# Patient Record
Sex: Male | Born: 1990
Health system: Southern US, Community
[De-identification: ages and names within clinical notes are randomized; demographics above are authoritative.]

## PROBLEM LIST (undated history)

## (undated) DIAGNOSIS — H7190 Unspecified cholesteatoma, unspecified ear: Secondary | ICD-10-CM

## (undated) DIAGNOSIS — F32A Depression, unspecified: Secondary | ICD-10-CM

## (undated) DIAGNOSIS — F329 Major depressive disorder, single episode, unspecified: Secondary | ICD-10-CM

## (undated) DIAGNOSIS — F419 Anxiety disorder, unspecified: Secondary | ICD-10-CM

## (undated) HISTORY — DX: Major depressive disorder, single episode, unspecified: F32.9

## (undated) HISTORY — DX: Depression, unspecified: F32.A

## (undated) HISTORY — DX: Anxiety disorder, unspecified: F41.9

## (undated) HISTORY — PX: INCISION AND DRAINAGE ABSCESS: SHX5864

## (undated) HISTORY — PX: SCROTAL EXPLORATION: SHX2386

---

## 2016-09-02 ENCOUNTER — Ambulatory Visit (INDEPENDENT_AMBULATORY_CARE_PROVIDER_SITE_OTHER): Payer: Self-pay | Admitting: Physician Assistant

## 2016-09-02 VITALS — BP 122/72 | HR 98 | Temp 98.8°F | Resp 17 | Ht 68.5 in | Wt 209.0 lb

## 2016-09-02 DIAGNOSIS — R059 Cough, unspecified: Secondary | ICD-10-CM

## 2016-09-02 DIAGNOSIS — H65491 Other chronic nonsuppurative otitis media, right ear: Secondary | ICD-10-CM

## 2016-09-02 DIAGNOSIS — J3489 Other specified disorders of nose and nasal sinuses: Secondary | ICD-10-CM

## 2016-09-02 DIAGNOSIS — R05 Cough: Secondary | ICD-10-CM

## 2016-09-02 MED ORDER — HYDROCODONE-CHLORPHENIRAMINE 5-4 MG/5ML PO SOLN: 5.0000 mL | Freq: Two times a day (BID) | ORAL | 0 refills | Status: DC | PRN

## 2016-09-02 MED ORDER — FLUTICASONE PROPIONATE 50 MCG/ACT NA SUSP: 2.0000 | Freq: Every day | NASAL | 12 refills | Status: DC

## 2016-09-02 MED ORDER — AMOXICILLIN 500 MG PO CAPS: 500.0000 mg | ORAL_CAPSULE | Freq: Three times a day (TID) | ORAL | 0 refills | Status: DC

## 2016-09-02 MED ORDER — BENZONATATE 100 MG PO CAPS: 100.0000 mg | ORAL_CAPSULE | Freq: Three times a day (TID) | ORAL | 0 refills | Status: DC | PRN

## 2016-09-02 NOTE — Progress Notes (Signed)
   Arthur Dodson  MRN: [id number] DOB: 03/04/1991  PCP: No PCP Per Patient  Subjective:  Pt is a 25 year old male, no reported PMH, who presents to clinic for feeling unwell.   Ear problems X 8 months. Reports ear fullness and pain off and on. One episode of drainage describes as wet and bloody three weeks ago.    Sinus pressure, congestion, SOB, headache, x four days. Throat swelling, achy, coughing fits to the point it feels like he's throw up. Notes fatigue.  Tried ibuprofen and dayquil. Denies fever, chills.  Tried sudafed, Helped a little.    No known sick contacts.  Allergies seasonal - sudafed, claritin.   Review of Systems  Constitutional: Positive for fatigue. Negative for chills, diaphoresis and fever.  HENT: Positive for congestion, ear discharge, ear pain, postnasal drip, sinus pressure and sore throat.   Respiratory: Positive for cough, chest tightness and shortness of breath.   Cardiovascular: Negative.   Gastrointestinal: Negative.     There are no active problems to display for this patient.   No current outpatient prescriptions on file prior to visit.   No current facility-administered medications on file prior to visit.     Not on File  Objective:  BP 122/72 (BP Location: Right Arm, Patient Position: Sitting, Cuff Size: Normal)   Pulse 98   Temp 98.8 F (37.1 C) (Oral)   Resp 17   Ht 5' 8.5" (1.74 m)   Wt 209 lb (94.8 kg)   SpO2 98%   BMI 31.32 kg/m   Physical Exam  Constitutional: He is oriented to person, place, and time and well-developed, well-nourished, and in no distress. No distress.  HENT:  Right Ear: Hearing, external ear and ear canal normal. Tympanic membrane is erythematous and bulging. Tympanic membrane is not perforated.  Left Ear: Hearing, tympanic membrane, external ear and ear canal normal.  Cardiovascular: Normal rate, regular rhythm and normal heart sounds.   Neurological: He is alert and oriented to person, place, and  time. GCS score is 15.  Skin: Skin is warm and dry.  Psychiatric: Mood, memory, affect and judgment normal.  Vitals reviewed.   Assessment and Plan :  1. Chronic nonsuppurative otitis media of right ear - amoxicillin (AMOXIL) 500 MG capsule; Take 1 capsule (500 mg total) by mouth 3 (three) times daily.  Dispense: 30 capsule; Refill: 0  2. Cough 3. Rhinorrhea - benzonatate (TESSALON) 100 MG capsule; Take 1-2 capsules (100-200 mg total) by mouth 3 (three) times daily as needed for cough.  Dispense: 40 capsule; Refill: 0 - Hydrocodone-Chlorpheniramine 5-4 MG/5ML SOLN; Take 5 mLs by mouth every 12 (twelve) hours as needed (cough).  Dispense: 100 mL; Refill: 0 - fluticasone (FLONASE) 50 MCG/ACT nasal spray; Place 2 sprays into both nostrils daily.  Dispense: 16 g; Refill: 12 - Supportive care: Drink plenty of fluids and rest. RTC in 7-10 days if not feeling better.   Marco CollieWhitney Brylei Pedley, PA-C  [facility] 09/02/2016 5:07 PM

## 2016-09-02 NOTE — Patient Instructions (Addendum)
Please be sure to drink plenty of fluids and rest.   Thank you for coming in today. I hope you feel we met your needs.  Feel free to call [facility] if you have any questions or further requests.  Please consider signing up for MyChart if you do not already have it, as this is a great way to communicate with me.  Best,  Whitney McVey, PA-C   IF you received an x-ray today, you will receive an invoice from [facility]. Please contact [facility] at [phone] with questions or concerns regarding your invoice.   IF you received labwork today, you will receive an invoice from [employer]. Please contact [facility] at [phone] with questions or concerns regarding your invoice.   Our billing staff will not be able to assist you with questions regarding bills from these companies.  You will be contacted with the lab results as soon as they are available. The fastest way to get your results is to activate your My Chart account. Instructions are located on the last page of this paperwork. If you have not heard from [place] regarding the results in 2 weeks, please contact this office.

## 2016-12-31 DIAGNOSIS — F331 Major depressive disorder, recurrent, moderate: Secondary | ICD-10-CM | POA: Diagnosis not present

## 2017-01-11 DIAGNOSIS — F332 Major depressive disorder, recurrent severe without psychotic features: Secondary | ICD-10-CM | POA: Diagnosis not present

## 2017-01-19 DIAGNOSIS — F332 Major depressive disorder, recurrent severe without psychotic features: Secondary | ICD-10-CM | POA: Diagnosis not present

## 2017-02-01 DIAGNOSIS — F332 Major depressive disorder, recurrent severe without psychotic features: Secondary | ICD-10-CM | POA: Diagnosis not present

## 2017-02-02 DIAGNOSIS — F332 Major depressive disorder, recurrent severe without psychotic features: Secondary | ICD-10-CM | POA: Diagnosis not present

## 2017-02-09 DIAGNOSIS — F332 Major depressive disorder, recurrent severe without psychotic features: Secondary | ICD-10-CM | POA: Diagnosis not present

## 2017-02-17 DIAGNOSIS — F332 Major depressive disorder, recurrent severe without psychotic features: Secondary | ICD-10-CM | POA: Diagnosis not present

## 2017-02-22 DIAGNOSIS — F332 Major depressive disorder, recurrent severe without psychotic features: Secondary | ICD-10-CM | POA: Diagnosis not present

## 2017-02-23 ENCOUNTER — Encounter (HOSPITAL_COMMUNITY): Payer: Self-pay | Admitting: Psychiatry

## 2017-02-23 ENCOUNTER — Encounter (INDEPENDENT_AMBULATORY_CARE_PROVIDER_SITE_OTHER): Payer: Self-pay

## 2017-02-23 ENCOUNTER — Other Ambulatory Visit (HOSPITAL_COMMUNITY): Payer: BLUE CROSS/BLUE SHIELD | Attending: Psychiatry | Admitting: Psychiatry

## 2017-02-23 DIAGNOSIS — F331 Major depressive disorder, recurrent, moderate: Secondary | ICD-10-CM | POA: Insufficient documentation

## 2017-02-23 DIAGNOSIS — Z79899 Other long term (current) drug therapy: Secondary | ICD-10-CM | POA: Insufficient documentation

## 2017-02-23 MED ORDER — TOPIRAMATE 50 MG PO TABS: 50.0000 mg | ORAL_TABLET | Freq: Every day | ORAL | 2 refills | Status: DC

## 2017-02-23 MED ORDER — ESCITALOPRAM OXALATE 10 MG PO TABS: 10.0000 mg | ORAL_TABLET | Freq: Every day | ORAL | 2 refills | Status: DC

## 2017-02-23 MED ORDER — CITALOPRAM HYDROBROMIDE 20 MG PO TABS: 20.0000 mg | ORAL_TABLET | Freq: Every day | ORAL | 2 refills | Status: DC

## 2017-02-23 NOTE — Progress Notes (Signed)
Comprehensive Clinical Assessment (CCA) Note  02/23/2017 Arthur Dodson [id number]  Visit Diagnosis:      ICD-9-CM ICD-10-CM   1. Moderate recurrent major depression (HCC) 296.32 F33.1       CCA Part One  Part One has been completed on paper by the patient.  (See scanned document in Chart Review)  CCA Part Two A  Intake/Chief Complaint:  CCA Intake With Chief Complaint CCA Part Two Date: 02/23/17 CCA Part Two Time: 1429 Chief Complaint/Presenting Problem: This is a 21 single, employed, Caucasian male; who was referred per Dr. Peterson Lombard; treatment for worsening depressive and anxiety symptoms.  States he's been depressed on and off for years.  Multiple stressors: 1) In 2003 father passed and pt has been mother's only support.  She has moved in with patient.  Is on total disability due to her medical  issues along with depression.  Voiced frustration with her not doing more to take care of her health (ie. hx of on and off of smoking cigarettes).  M-GF died three months later after patient's father died.  Then step grandmother sued pt's mother over the will.  In Christmas 2017 he had met a 27 yo lady online, but whenever she went on Christmas break she ended all contact with him.  Denies any prior psychiatric admissions or suicide attempts or gestures.  In 2015 saw a therapist due to depression/anxiety issues.  Family Hx:  Mother (depression)                                                       Patients Currently Reported Symptoms/Problems: Sadness, anhedonia, decreased self esteem, fatigue, irritable, isolative, tearful at times, poor sleep, difficulty concentrating, anxious Collateral Involvement: Mother is supportive. Individual's Strengths: Pt is motivated for treatment. Type of Services Patient Feels Are Needed: MH-IOP  Mental Health Symptoms Depression:  Depression: Change in energy/activity, Difficulty Concentrating, Fatigue, Irritability, Tearfulness, Sleep (too much or little)   Mania:  Mania: N/A  Anxiety:   Anxiety: Irritability  Psychosis:  Psychosis: N/A  Trauma:  Trauma: N/A  Obsessions:  Obsessions: N/A  Compulsions:  Compulsions: N/A  Inattention:  Inattention: N/A  Hyperactivity/Impulsivity:  Hyperactivity/Impulsivity: N/A  Oppositional/Defiant Behaviors:  Oppositional/Defiant Behaviors: N/A  Borderline Personality:  Emotional Irregularity: N/A  Other Mood/Personality Symptoms:      Mental Status Exam Appearance and self-care  Stature:  Stature: Average  Weight:  Weight: Average weight  Clothing:  Clothing: Casual  Grooming:  Grooming: Normal  Cosmetic use:  Cosmetic Use: None  Posture/gait:  Posture/Gait: Normal  Motor activity:  Motor Activity: Not Remarkable  Sensorium  Attention:  Attention: Normal  Concentration:  Concentration: Normal  Orientation:  Orientation: X5  Recall/memory:  Recall/Memory: Normal  Affect and Mood  Affect:  Affect: Anxious  Mood:  Mood: Depressed  Relating  Eye contact:  Eye Contact: Normal  Facial expression:  Facial Expression: Anxious  Attitude toward examiner:  Attitude Toward Examiner: Cooperative  Thought and Language  Speech flow: Speech Flow: Normal  Thought content:  Thought Content: Appropriate to mood and circumstances  Preoccupation:     Hallucinations:     Organization:     Transport planner of Knowledge:  Fund of Knowledge: Average  Intelligence:  Intelligence: Average  Abstraction:  Abstraction: Normal  Judgement:  Judgement: Normal  Reality Testing:  Reality Testing: Adequate  Insight:  Insight: Good  Decision Making:  Decision Making: Normal  Social Functioning  Social Maturity:  Social Maturity: Isolates, Responsible  Social Judgement:  Social Judgement: Normal  Stress  Stressors:  Stressors: Brewing technologist, Housing, Work, Psychologist, forensic Ability:  Coping Ability: English as a second language teacher Deficits:     Supports:      Family and Psychosocial History: Family history Marital status:  Single Are you sexually active?: No What is your sexual orientation?: heterosexual Does patient have children?: No  Childhood History:  Childhood History By whom was/is the patient raised?: Both parents Additional childhood history information: Born in [place], [place].  States he grew up poor, but never went without.  Mother was always ill with health issues.  States he was bullied a lot because he cried a lot.  "I wasn't a tough kid.  I was a quiet, nerdy kid."  4th grade moved to [employer].  His father became ill and was in the hospital for three months before he died.  According to pt, his mom kept him out of school to be at the hospital with her, so he missed a lot of school.  States he discovered his love for theatre in the 9th grade. Patient's description of current relationship with people who raised him/her: Pt's mother resides with him. Does patient have siblings?: No Did patient suffer any verbal/emotional/physical/sexual abuse as a child?: No Did patient suffer from severe childhood neglect?: No Has patient ever been sexually abused/assaulted/raped as an adolescent or adult?: No Was the patient ever a victim of a crime or a disaster?: No Witnessed domestic violence?: No Has patient been effected by domestic violence as an adult?: No  CCA Part Two B  Employment/Work Situation: Employment / Work Copywriter, advertising Employment situation: Employed Where is patient currently employed?: [employer]  How long has patient been employed?: nine months Patient's job has been impacted by current illness: No Has patient ever been in the [employer]?: No Has patient ever served in combat?: No Did You Receive Any Psychiatric Treatment/Services While in Passenger transport manager?: No Are There Guns or Other Weapons in [place]?: No Are These Psychologist, educational?: No  Education: Education Did Teacher, adult education From [employer]?: Yes Did Physicist, medical?: Yes What Type of College 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: Yes Explanation of Sleeping Difficulties: Can't get to sleep due to anxiety  CCA Part Two C  Alcohol/Drug Use: Alcohol / Drug Use History of alcohol / drug use?: No history of alcohol / drug abuse                      CCA Part Three  ASAM's:  Six Dimensions of Multidimensional Assessment  Dimension 1:  Acute Intoxication and/or Withdrawal Potential:     Dimension 2:  Biomedical Conditions and Complications:     Dimension 3:  Emotional, Behavioral, or Cognitive Conditions and Complications:  Dimension 4:  Readiness to Change:     Dimension 5:  Relapse, Continued use, or Continued Problem Potential:     Dimension 6:  Recovery/Living Environment:      Substance use Disorder (SUD)    Social Function:  Social Functioning Social Maturity: Isolates, Responsible Social Judgement: Normal  Stress:  Stress Stressors: Grief/losses, Housing, Work, Barista Ability: Overwhelmed Patient Takes Medications The Way The Doctor Instructed?: Yes Priority Risk: Moderate Risk  Risk Assessment- Self-Harm Potential: Risk Assessment For Self-Harm Potential Thoughts of Self-Harm: No current thoughts Method: No plan Availability of Means: No access/NA  Risk Assessment -Dangerous to Others Potential: Risk Assessment For Dangerous to Others Potential Method: No Plan Availability of  Means: No access or NA Intent: Vague intent or NA Notification Required: No need or identified person  DSM5 Diagnoses: Patient Active Problem List   Diagnosis Date Noted  . Moderate recurrent major depression ([place]) 02/23/2017    Class: Chronic   Patient Centered Plan: Patient is on the following Treatment Plan(s):  Anxiety and Depression  Recommendations for Services/Supports/Treatments: Recommendations for Services/Supports/Treatments Recommendations For Services/Supports/Treatments: IOP (Intensive Outpatient Program)  Treatment Plan Summary:   Oriented pt to MH-IOP.  Provided pt with an orientation folder.  Pt will participate in [place] for two weeks.  Encouraged support groups.  F/U with Dr. Daron Offer and Dr. Peterson Lombard. Referrals to Alternative Service(s): Referred to Alternative Service(s):   Place:   Date:   Time:    Referred to Alternative Service(s):   Place:   Date:   Time:    Referred to Alternative Service(s):   Place:   Date:   Time:    Referred to Alternative Service(s):   Place:   Date:   Time:     Arthur Dodson, RITA, M.Ed, CNA

## 2017-02-23 NOTE — Progress Notes (Signed)
Psychiatric Initial Adult Assessment   Patient Identification: Arthur Dodson MRN:  [id number] Date of Evaluation:  02/23/2017 Referral Source: self Chief Complaint:   Chief Complaint    Depression; Anxiety; Stress     Visit Diagnosis:    ICD-9-CM ICD-10-CM   1. Moderate recurrent major depression (HCC) 296.32 F33.1     History of Present Illness:  [name] has been depressed off and on for years.  Currently he has been depressed for some months and getting worse.  Stresses are tking care of his mother financially and emotionally letting her live with him and the frustration of her not doing more to take care of herself.  Feels stuck in his life overanalyzing himself but getting nowhere.  Depression has reached the point that he cannot work, does not enjoy things he used to enjoy, looks forward to nothing, avoids friends, feels sad, wants to stay in bed all day. Feels guilty that he cannot get himself out of this.  Long term stresses are the death of his father when he was in the fourth grade, his mother's life long depression and emotional dependence on him the death of his maternal grandfather shortly after his father died, his step grandmother suing his mother over the will, being bullied as a kid all the way through school.  He can act okay but does not feel okay and needs to get some help he says.  Associated Signs/Symptoms: Depression Symptoms:  depressed mood, anhedonia, insomnia, hypersomnia, fatigue, difficulty concentrating, impaired memory, anxiety, (Hypo) Manic Symptoms:  Irritable Mood, Anxiety Symptoms:  Excessive Worry, Psychotic Symptoms:  none PTSD Symptoms: Negative  Past Psychiatric History: has been in outpatient therapy and taken medications with no inpatient wxperience  Previous Psychotropic Medications: Yes   Substance Abuse History in the last 12 months:  No.  Consequences of Substance Abuse: Negative  Past Medical History:  Past Medical History:   Diagnosis Date  . Anxiety   . Depression    History reviewed. No pertinent surgical history.  Family Psychiatric History: mother depressed  Family History:  Family History  Problem Relation Age of Onset  . Depression Mother     Social History:   Social History   Social History  . Marital status: Single    Spouse name: N/A  . Number of children: N/A  . Years of education: N/A   Social History Main Topics  . Smoking status: Never Smoker  . Smokeless tobacco: Never Used  . Alcohol use No  . Drug use: No  . Sexual activity: No   Other Topics Concern  . None   Social History Narrative  . None    Additional Social History: Engineer, maintenance (IT)college graduate, not married, no children  Allergies:  No Known Allergies  Metabolic Disorder Labs: No results found for: HGBA1C, MPG No results found for: PROLACTIN No results found for: CHOL, TRIG, HDL, CHOLHDL, VLDL, LDLCALC   Current Medications: Current Outpatient Prescriptions  Medication Sig Dispense Refill  . escitalopram (LEXAPRO) 10 MG tablet Take 10 mg by mouth daily.    . fluticasone (FLONASE) 50 MCG/ACT nasal spray Place 2 sprays into both nostrils daily. 16 g 12  . topiramate (TOPAMAX) 50 MG tablet Take 1 tablet (50 mg total) by mouth daily. 30 tablet 2   No current facility-administered medications for this visit.     Neurologic: Headache: Negative Seizure: Negative Paresthesias:Negative  Musculoskeletal: Strength & Muscle Tone: within normal limits Gait & Station: normal Patient leans: N/A  Psychiatric Specialty Exam: ROS  There were no vitals taken for this visit.There is no height or weight on file to calculate BMI.  General Appearance: Well Groomed  Eye Contact:  Good  Speech:  Clear and Coherent  Volume:  Normal  Mood:  Depressed  Affect:  Congruent  Thought Process:  Coherent and Goal Directed  Orientation:  Full (Time, Place, and Person)  Thought Content:  Logical  Suicidal Thoughts:  No  Homicidal  Thoughts:  No  Memory:  Immediate;   Good Recent;   Good Remote;   Good  Judgement:  Intact  Insight:  Good  Psychomotor Activity:  Normal  Concentration:  Concentration: Good and Attention Span: Good  Recall:  Good  Fund of Knowledge:Good  Language: Good  Akathisia:  Negative  Handed:  Right  AIMS (if indicated):  0  Assets:  Communication Skills Desire for Improvement Financial Resources/Insurance Housing Leisure Time Physical Health Resilience Social Support Talents/Skills Transportation Vocational/Educational  ADL's:  Intact  Cognition: WNL  Sleep:  poor    Treatment Plan Summary: Admit to IOP with daily group therapy.  Continue current meds   Carolanne Grumbling, MD 3/20/201812:36 PM

## 2017-02-24 ENCOUNTER — Other Ambulatory Visit (HOSPITAL_COMMUNITY): Payer: BLUE CROSS/BLUE SHIELD | Admitting: Psychiatry

## 2017-02-24 DIAGNOSIS — F331 Major depressive disorder, recurrent, moderate: Secondary | ICD-10-CM

## 2017-02-24 DIAGNOSIS — Z79899 Other long term (current) drug therapy: Secondary | ICD-10-CM | POA: Diagnosis not present

## 2017-02-24 NOTE — Progress Notes (Signed)
    Daily Group Progress Note  Program: IOP  Group Time: 9:00-12:00  Participation Level: Active  Behavioral Response: Appropriate  Type of Therapy:  Group Therapy  Summary of Progress: Pt. Presented with bright affect, talkative, engaged in the group process. Pt. Participated in grief and loss session with the Chaplain. Pt. Discussed awareness of needs for perfection and fears of appearing inadequate in social situations. Pt. Became tearful when discussing his difficulty experiencing vulnerability. Pt. Participated in breath focused meditation exercise and indicated that it helped him to relax.     Boneta LucksJennifer Tywana Robotham, Ph.D., [facility]

## 2017-02-25 ENCOUNTER — Other Ambulatory Visit (HOSPITAL_COMMUNITY): Payer: BLUE CROSS/BLUE SHIELD | Admitting: Psychiatry

## 2017-02-25 DIAGNOSIS — F331 Major depressive disorder, recurrent, moderate: Secondary | ICD-10-CM

## 2017-02-25 DIAGNOSIS — Z79899 Other long term (current) drug therapy: Secondary | ICD-10-CM | POA: Diagnosis not present

## 2017-02-26 ENCOUNTER — Other Ambulatory Visit (HOSPITAL_COMMUNITY): Payer: BLUE CROSS/BLUE SHIELD | Admitting: Psychiatry

## 2017-02-26 DIAGNOSIS — F331 Major depressive disorder, recurrent, moderate: Secondary | ICD-10-CM

## 2017-02-26 DIAGNOSIS — Z79899 Other long term (current) drug therapy: Secondary | ICD-10-CM | POA: Diagnosis not present

## 2017-02-26 NOTE — Progress Notes (Signed)
    Daily Group Progress Note  Program: IOP  Group Time: 9:00-12:00   Participation Level: active    Behavioral Response: engaged   Type of Therapy:  group therapy   Summary of Progress: Pt. was vocal about his process of understanding things cognitively and how incorporating his emotional processes is difficult.  He reported spending much time being "cerebral" but he knows that emotions have an important place in his life.  It feels new for him to involve his emotions, but he is ready and willing to discuss it more and feel more. Pt. Participated in discussion with mental health association.  Shaune PollackBrown, Jennifer B, LPC

## 2017-03-01 ENCOUNTER — Ambulatory Visit (INDEPENDENT_AMBULATORY_CARE_PROVIDER_SITE_OTHER): Payer: BLUE CROSS/BLUE SHIELD | Admitting: Psychiatry

## 2017-03-01 ENCOUNTER — Other Ambulatory Visit (HOSPITAL_COMMUNITY): Payer: BLUE CROSS/BLUE SHIELD

## 2017-03-01 ENCOUNTER — Encounter (HOSPITAL_COMMUNITY): Payer: Self-pay | Admitting: Psychiatry

## 2017-03-01 VITALS — BP 122/74 | HR 104 | Ht 68.0 in | Wt 220.0 lb

## 2017-03-01 DIAGNOSIS — Z818 Family history of other mental and behavioral disorders: Secondary | ICD-10-CM

## 2017-03-01 DIAGNOSIS — Z79899 Other long term (current) drug therapy: Secondary | ICD-10-CM

## 2017-03-01 DIAGNOSIS — F3341 Major depressive disorder, recurrent, in partial remission: Secondary | ICD-10-CM | POA: Diagnosis not present

## 2017-03-01 MED ORDER — FLUOXETINE HCL 20 MG PO CAPS: 20.0000 mg | ORAL_CAPSULE | Freq: Every day | ORAL | 1 refills | Status: DC
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
## 2017-03-01 NOTE — Progress Notes (Signed)
    Daily Group Progress Note  Program: IOP Group Time: 9:00-12:00   Participation Level: active   Behavioral Response:  engaged   Type of Therapy:  group therapy  Summary of Progress: Pt continues to feel "stuck in his head". He expressed a lot of kindness and support for another group member who fears her marriage is falling apart. Pt recognizes he needs to be kinder to himself, and experience his emotions more, but feels uncertain how to do so, and hopes to learn some skills for reconnecting emotionally from the group. Pt. Participated in grief and loss with the chaplain.  Shaune PollackBrown, Jennifer B, LPC

## 2017-03-01 NOTE — Progress Notes (Signed)
    Daily Group Progress Note  Program: IOP  Group Time: 9:00-12:00   Participation Level: active   Behavioral Response:  engaged   Type of Therapy:   group therapy  Summary of Progress: This was the pt's first day in group. He was open and communicative about how he has tried to "think" his way out of his problems, and how that is no longer working for him. He prizes his intellect, and kindness, however he feels he is unable to extend any kindness to himself.   Shaune PollackBrown, Jennifer B, LPC

## 2017-03-02 ENCOUNTER — Other Ambulatory Visit (HOSPITAL_COMMUNITY): Payer: BLUE CROSS/BLUE SHIELD | Admitting: Psychiatry

## 2017-03-02 DIAGNOSIS — Z79899 Other long term (current) drug therapy: Secondary | ICD-10-CM | POA: Diagnosis not present

## 2017-03-02 DIAGNOSIS — F331 Major depressive disorder, recurrent, moderate: Secondary | ICD-10-CM | POA: Diagnosis not present

## 2017-03-02 NOTE — Progress Notes (Signed)
    Daily Group Progress Note  Program: IOP Group Time: 9:00-12:00   Participation Level:  Active   Behavioral Response:  Engaged   Type of Therapy:  Group Therapy  Summary of Progress: Patient was attentive to others and offered insight/perspective that other group members showed appreciation for. One example of his insight was his processing of how a goal may be limiting.  Pt shared with group what his thought process of cyclical thinking actually sounds like when he feels "stuck in his head". He processed some of his fears of the unknown; especially those fears related to returning to work and the fear of never achieving recovery. Pt received feedback from other group members and reported this was helpful as he fears would most likely be judgmental of his thoughts if they were aware. Patient acknowledges that he is often his 'own worse judge' and sometimes allows 'the committee to influence the judge.'  Carney Bernatherine C Ebin Palazzi, LCSW

## 2017-03-03 ENCOUNTER — Other Ambulatory Visit (HOSPITAL_COMMUNITY): Payer: BLUE CROSS/BLUE SHIELD | Admitting: *Deleted

## 2017-03-03 DIAGNOSIS — F331 Major depressive disorder, recurrent, moderate: Secondary | ICD-10-CM | POA: Diagnosis not present

## 2017-03-03 DIAGNOSIS — Z79899 Other long term (current) drug therapy: Secondary | ICD-10-CM | POA: Diagnosis not present

## 2017-03-03 DIAGNOSIS — F3341 Major depressive disorder, recurrent, in partial remission: Secondary | ICD-10-CM

## 2017-03-03 NOTE — Progress Notes (Signed)
Daily Group Progress Note  Program: IOP  03/03/2017   Group Time: 9 AM - 10:30 AM  Participation Level: Active  Type of Therapy:  Process Group  Summary of Progress: Patient presented with appropriate affect and engaged in group processing. Patient was attentive to others and shared how he related to successes others are experiencing.      Group Time: 10:45 AM - 12 PM  Participation Level:  Active  Type of Therapy: Psycho-education Group  Summary of Progress: Patient participated in group exercise on self compassion and shared differences in how he treats others compared to how he treats himself. Patient recognizes how he has tendency to be authoritative and judgmental with self. Facilitator presented a home practice exercise which group members agreed to complete at least once before group the following day.    Carney Bernatherine C Giovanni Biby, LCSW

## 2017-03-04 ENCOUNTER — Other Ambulatory Visit (HOSPITAL_COMMUNITY): Payer: BLUE CROSS/BLUE SHIELD | Admitting: *Deleted

## 2017-03-04 DIAGNOSIS — F3341 Major depressive disorder, recurrent, in partial remission: Secondary | ICD-10-CM

## 2017-03-04 DIAGNOSIS — Z79899 Other long term (current) drug therapy: Secondary | ICD-10-CM | POA: Diagnosis not present

## 2017-03-04 DIAGNOSIS — F331 Major depressive disorder, recurrent, moderate: Secondary | ICD-10-CM | POA: Diagnosis not present

## 2017-03-04 NOTE — Progress Notes (Signed)
  [facility] - IOP PSYCH  03/04/2017   Group Time: 9 AM - 10:30 AM  Participation Level: Active  Type of Therapy:  Process Group  Summary of Progress: Patient was attentive and supportive as others processed experience with the previous day's home practice and checked in with others in the group. Patient shared that he did not use the home practice as he felt he is still ''recovering/bouncing back from my breakdown Tuesday."     Group Time: 10:45 AM - 12 PM  Participation Level:  Active   Type of Therapy: Process Group  Summary of Progress: Patient offered support and feedback to those who continued to process their thoughts/feelings related to their perception pf the progress. Patient identified with others pain they experience in roles of care giving.     Carney Bernatherine C Harrill, LCSW

## 2017-03-05 ENCOUNTER — Other Ambulatory Visit (HOSPITAL_COMMUNITY): Payer: BLUE CROSS/BLUE SHIELD | Admitting: Psychiatry

## 2017-03-05 DIAGNOSIS — F331 Major depressive disorder, recurrent, moderate: Secondary | ICD-10-CM | POA: Diagnosis not present

## 2017-03-05 DIAGNOSIS — Z79899 Other long term (current) drug therapy: Secondary | ICD-10-CM | POA: Diagnosis not present

## 2017-03-05 DIAGNOSIS — F3341 Major depressive disorder, recurrent, in partial remission: Secondary | ICD-10-CM

## 2017-03-08 ENCOUNTER — Other Ambulatory Visit (HOSPITAL_COMMUNITY): Payer: BLUE CROSS/BLUE SHIELD | Attending: Psychiatry | Admitting: Psychiatry

## 2017-03-08 DIAGNOSIS — F331 Major depressive disorder, recurrent, moderate: Secondary | ICD-10-CM | POA: Insufficient documentation

## 2017-03-08 NOTE — Progress Notes (Signed)
    Daily Group Progress Note  Program: IOP  Group Time: 9:00-12:00   Participation Level:  active   Behavioral Response:  engaged   Type of Therapy:   group therapy  Summary of Progress: Pt feels he has emotionally rebounded from what he has referred to as his "breakdown" in group at the beginning of the week. He realizes he needs to express compassion to himself, but struggle with the intellectual disconnect he feels between his mind and his emotions. Pt. Participated in grief and loss with the Chaplain.  Shaune Pollack, LPC

## 2017-03-09 ENCOUNTER — Other Ambulatory Visit (HOSPITAL_COMMUNITY): Payer: BLUE CROSS/BLUE SHIELD | Admitting: Psychiatry

## 2017-03-09 DIAGNOSIS — F3341 Major depressive disorder, recurrent, in partial remission: Secondary | ICD-10-CM

## 2017-03-09 DIAGNOSIS — F331 Major depressive disorder, recurrent, moderate: Secondary | ICD-10-CM | POA: Diagnosis not present

## 2017-03-10 ENCOUNTER — Other Ambulatory Visit (HOSPITAL_COMMUNITY): Payer: BLUE CROSS/BLUE SHIELD | Admitting: Psychiatry

## 2017-03-10 DIAGNOSIS — F3341 Major depressive disorder, recurrent, in partial remission: Secondary | ICD-10-CM

## 2017-03-10 DIAGNOSIS — F331 Major depressive disorder, recurrent, moderate: Secondary | ICD-10-CM | POA: Diagnosis not present

## 2017-03-10 NOTE — Progress Notes (Signed)
    Daily Group Progress Note  Program: IOP  Group Time: 9:00-12:00  Participation Level: Active  Behavioral Response: Appropriate  Type of Therapy:  Group Therapy  Summary of Progress: Pt was quiet during group and did not add much to group conversation. He stated that he is here to find a "different perspective" because his fear and anxiety have been holding him back from living a "vibrant life". He states that he often has difficulty with black and white thinking and understanding his own emotions. He says that he has to take care of his mother which he resents because the inheritance he got from his grandparents are used for this instead of him having it to better himself. Therapist validated his feelings and provided support and encouragement. Pt. Participated in discussion about co-dependent relationships.     Shaune Pollack, LPC

## 2017-03-11 ENCOUNTER — Other Ambulatory Visit (HOSPITAL_COMMUNITY): Payer: BLUE CROSS/BLUE SHIELD | Admitting: Psychiatry

## 2017-03-11 DIAGNOSIS — F3341 Major depressive disorder, recurrent, in partial remission: Secondary | ICD-10-CM

## 2017-03-11 DIAGNOSIS — F331 Major depressive disorder, recurrent, moderate: Secondary | ICD-10-CM | POA: Diagnosis not present

## 2017-03-11 NOTE — Progress Notes (Signed)
Patient ID: [name], male   DOB: 15-Sep-1991, 26 y.o.   MRN: [id number] [facility] IOP DISCHARGE NOTE  Patient:  Arthur Dodson DOB:  01-Jul-1991  Date of Admission: 02/24/2017  Date of Discharge: 03/11/2017  Reason for Admission:depression  IOP Course:attended and participated.  Felt much better and optimistic that her can continue feeling better.    Mental Status at Discharge:no suicidal thoughts  Diagnosis: major depression, recurrent moderate  Level of Care:  IOP  Discharge destination: has appointment with psychiatrist and will make appointment with his therapist    Comments:  none  The patient received suicide prevention pamphlet:  Yes   Carolanne Grumbling, MD

## 2017-03-11 NOTE — Patient Instructions (Signed)
D:  Pt completed MH-IOP today.  A:  Follow up with Dr. Rene Kocher and Dr. Everardo Pacific.  Return to work on 03-15-17, without any restrictions.  R:  Pt receptive.

## 2017-03-12 ENCOUNTER — Other Ambulatory Visit (HOSPITAL_COMMUNITY): Payer: BLUE CROSS/BLUE SHIELD

## 2017-03-12 NOTE — Progress Notes (Signed)
Arthur Dodson is a 56 single, employed, Caucasian male; who was referred per Dr. Peterson Lombard; treatment for worsening depressive and anxiety symptoms.  Stated he'd been depressed on and off for years.  Multiple stressors: 1) In 2003 father passed and pt has been mother's only support.  She has moved in with patient.  Is on total disability due to her medical  issues along with depression.  Voiced frustration with her not doing more to take care of her health (ie. hx of on and off of smoking cigarettes).  M-GF died three months later after patient's father died.  Then step grandmother sued pt's mother over the will.  In Christmas 2017 he had met a 27 yo lady online, but whenever she went on Christmas break she ended all contact with him.  Denied any prior psychiatric admissions or suicide attempts or gestures.  In 2015 saw a therapist due to depression/anxiety issues.  Family Hx:  Mother (depression). Pt successfully completed MH-IOP.  Pt wanted to stay longer.  "I really enjoy groups.  The connectivity was very helpful."  Reports feeling anxious about leaving but mentioned he is also excited about applying his skills that he learned.  Continues to deny SI/HI or A/V hallucinations.  A:  D/C today.  F/U with Drs. Eksir and [employer].  RTW without any restrictions on 03-15-17.  Encouraged support groups.                         Arthur Dodson, RITA, M.Ed, CNA

## 2017-03-15 ENCOUNTER — Other Ambulatory Visit (HOSPITAL_COMMUNITY): Payer: BLUE CROSS/BLUE SHIELD

## 2017-03-15 NOTE — Progress Notes (Signed)
    Daily Group Progress Note  Program: IOP  Group Time: 9:00-12:00   Participation Level:  active   Behavioral Response:  engaged   Type of Therapy:  group therapy  Summary of Progress: Pt shared about his continued struggles with his inner judge, and his deep desire to make peace with his mother. He feels deeply resentful of her, to the point of "hating" her, but feels the word hate is a bit absurd. He also recognizes that he loves his mother, regardless of the wrongs she's done to him.   Shaune Pollack, LPC

## 2017-03-15 NOTE — Progress Notes (Signed)
    Daily Group Progress Note  Program: IOP  Group Time: 9:00-12:00   Participation Level: active   Behavioral Response:  engaged   Type of Therapy:   group therapy   Summary of Progress: Today was Pt.'s discharge date and he shared some of his journey to integrating his head and his heart; not just intellectualizing everything.  The group shared nice sentiments with Pt. and how he has helped each of them with their own process.  Shaune Pollack, LPC

## 2017-03-16 ENCOUNTER — Other Ambulatory Visit (HOSPITAL_COMMUNITY): Payer: BLUE CROSS/BLUE SHIELD

## 2017-03-31 DIAGNOSIS — H65194 Other acute nonsuppurative otitis media, recurrent, right ear: Secondary | ICD-10-CM | POA: Diagnosis not present

## 2017-04-07 DIAGNOSIS — H66004 Acute suppurative otitis media without spontaneous rupture of ear drum, recurrent, right ear: Secondary | ICD-10-CM | POA: Diagnosis not present

## 2017-04-07 DIAGNOSIS — Z7689 Persons encountering health services in other specified circumstances: Secondary | ICD-10-CM | POA: Diagnosis not present

## 2017-04-18 DIAGNOSIS — H6691 Otitis media, unspecified, right ear: Secondary | ICD-10-CM | POA: Diagnosis not present

## 2017-04-19 ENCOUNTER — Ambulatory Visit (INDEPENDENT_AMBULATORY_CARE_PROVIDER_SITE_OTHER): Payer: BLUE CROSS/BLUE SHIELD | Admitting: Psychiatry

## 2017-04-19 ENCOUNTER — Encounter (HOSPITAL_COMMUNITY): Payer: Self-pay | Admitting: Psychiatry

## 2017-04-19 DIAGNOSIS — Z818 Family history of other mental and behavioral disorders: Secondary | ICD-10-CM | POA: Diagnosis not present

## 2017-04-19 DIAGNOSIS — F3341 Major depressive disorder, recurrent, in partial remission: Secondary | ICD-10-CM

## 2017-04-19 DIAGNOSIS — Z79899 Other long term (current) drug therapy: Secondary | ICD-10-CM | POA: Diagnosis not present

## 2017-04-19 MED ORDER — FLUOXETINE HCL 40 MG PO CAPS: 40.0000 mg | ORAL_CAPSULE | Freq: Every day | ORAL | 1 refills | Status: DC

## 2017-04-19 NOTE — Progress Notes (Signed)
BH MD/PA/NP OP Progress Note  04/19/2017 8:34 AM Jadd Gasior  MRN:  [id number]  Chief Complaint:  Chief Complaint    Follow-up     Subjective:  Arthur Dodson today for depression follow-up. He reports that Prozac has been well tolerated, and his mood is much more improved than Lexapro and Topamax. He confirms that he has stopped taking both of those medications. He denies any suicidality. He has had some stress from work, and is struggling with her chronic ear infection. He reports that his mood is about 60% better, and wonders about increasing the Prozac to 40 mg. We agreed to increase to 40 mg, and reviewed the risks and benefits. Agrees to follow-up in 3 months.  Visit Diagnosis:    ICD-9-CM ICD-10-CM   1. Recurrent major depressive disorder, in partial remission (HCC) 296.35 F33.41 FLUoxetine (PROZAC) 40 MG capsule    Past Psychiatric History: See intake H&P for full details. Reviewed, with no updates at this time.   Past Medical History:  Past Medical History:  Diagnosis Date  . Anxiety   . Depression    History reviewed. No pertinent surgical history.  Family Psychiatric History: See intake H&P for full details. Reviewed, with no updates at this time.   Family History:  Family History  Problem Relation Age of Onset  . Depression Mother     Social History:  Social History   Social History  . Marital status: Single    Spouse name: N/A  . Number of children: N/A  . Years of education: N/A   Social History Main Topics  . Smoking status: Never Smoker  . Smokeless tobacco: Never Used  . Alcohol use No  . Drug use: No  . Sexual activity: No   Other Topics Concern  . None   Social History Narrative  . None    Allergies: No Known Allergies  Metabolic Disorder Labs: No results found for: HGBA1C, MPG No results found for: PROLACTIN No results found for: CHOL, TRIG, HDL, CHOLHDL, VLDL, LDLCALC   Current Medications: Current Outpatient Prescriptions   Medication Sig Dispense Refill  . FLUoxetine (PROZAC) 40 MG capsule Take 1 capsule (40 mg total) by mouth daily. 90 capsule 1  . azithromycin (ZITHROMAX) 250 MG tablet   0  . fluticasone (FLONASE) 50 MCG/ACT nasal spray Place 2 sprays into both nostrils daily. (Patient not taking: Reported on 04/19/2017) 16 g 12   No current facility-administered medications for this visit.     Neurologic: Headache: Negative Seizure: Negative Paresthesias: Negative  Musculoskeletal: Strength & Muscle Tone: within normal limits Gait & Station: normal Patient leans: N/A  Psychiatric Specialty Exam: ROS  Blood pressure 102/78, pulse 92, height 5' 8.6" (1.742 m), weight 221 lb (100.2 kg), SpO2 98 %.Body mass index is 33.02 kg/m.  General Appearance: Casual and Fairly Groomed  Eye Contact:  Good  Speech:  Clear and Coherent  Volume:  Normal  Mood:  Dysphoric  Affect:  Appropriate  Thought Process:  Coherent and Goal Directed  Orientation:  Full (Time, Place, and Person)  Thought Content: Logical   Suicidal Thoughts:  No  Homicidal Thoughts:  No  Memory:  Immediate;   Good  Judgement:  Good  Insight:  Good  Psychomotor Activity:  Normal  Concentration:  Concentration: Good  Recall:  Good  Fund of Knowledge: NA  Language: Good  Akathisia:  Negative  Handed:  Right  AIMS (if indicated):  0  Assets:  Communication Skills Desire for Improvement Housing Physical  Health Social Support Talents/Skills Transportation Vocational/Educational  ADL's:  Intact  Cognition: WNL  Sleep:  7-9 hours    Treatment Plan Summary: Arthur Lenisnthony Amadon is a 26 year old male with major depressive disorder, with significant improvement from Prozac, but still room for further improvement. No acute safety issues. We will increase fluoxetine to 40 mg and follow-up in 3 months.  1. Recurrent major depressive disorder, in partial remission (HCC)    Increase fluoxetine to 40 mg Follow-up in 3 months Patient to  schedule with his external therapist for follow-up, given that he is recently completed the IOP program   Burnard LeighAlexander Arya Tessica Cupo, MD 04/19/2017, 8:34 AM

## 2017-05-04 DIAGNOSIS — H9201 Otalgia, right ear: Secondary | ICD-10-CM | POA: Diagnosis not present

## 2017-05-04 DIAGNOSIS — H66004 Acute suppurative otitis media without spontaneous rupture of ear drum, recurrent, right ear: Secondary | ICD-10-CM | POA: Diagnosis not present

## 2017-05-06 DIAGNOSIS — J343 Hypertrophy of nasal turbinates: Secondary | ICD-10-CM | POA: Diagnosis not present

## 2017-05-06 DIAGNOSIS — H90A11 Conductive hearing loss, unilateral, right ear with restricted hearing on the contralateral side: Secondary | ICD-10-CM | POA: Diagnosis not present

## 2017-05-06 DIAGNOSIS — H7101 Cholesteatoma of attic, right ear: Secondary | ICD-10-CM | POA: Diagnosis not present

## 2017-05-07 ENCOUNTER — Other Ambulatory Visit: Payer: Self-pay | Admitting: Otolaryngology

## 2017-05-07 DIAGNOSIS — H7101 Cholesteatoma of attic, right ear: Secondary | ICD-10-CM

## 2017-05-11 ENCOUNTER — Ambulatory Visit
Admission: RE | Admit: 2017-05-11 | Discharge: 2017-05-11 | Disposition: A | Discharge status: Home / Self Care | Payer: BLUE CROSS/BLUE SHIELD | Source: Ambulatory Visit | Attending: Otolaryngology | Admitting: Otolaryngology

## 2017-05-11 DIAGNOSIS — H9190 Unspecified hearing loss, unspecified ear: Secondary | ICD-10-CM | POA: Diagnosis not present

## 2017-05-11 DIAGNOSIS — H7101 Cholesteatoma of attic, right ear: Secondary | ICD-10-CM

## 2017-06-18 ENCOUNTER — Emergency Department (HOSPITAL_COMMUNITY): Payer: BLUE CROSS/BLUE SHIELD

## 2017-06-18 ENCOUNTER — Encounter (HOSPITAL_COMMUNITY): Payer: Self-pay | Admitting: Emergency Medicine

## 2017-06-18 ENCOUNTER — Observation Stay (HOSPITAL_COMMUNITY)
Admission: EM | Admit: 2017-06-18 | Discharge: 2017-06-19 | Disposition: A | Discharge status: Home / Self Care | Payer: BLUE CROSS/BLUE SHIELD | Attending: Family Medicine | Admitting: Family Medicine

## 2017-06-18 DIAGNOSIS — F331 Major depressive disorder, recurrent, moderate: Secondary | ICD-10-CM | POA: Diagnosis not present

## 2017-06-18 DIAGNOSIS — E669 Obesity, unspecified: Secondary | ICD-10-CM | POA: Diagnosis not present

## 2017-06-18 DIAGNOSIS — R05 Cough: Secondary | ICD-10-CM | POA: Diagnosis not present

## 2017-06-18 DIAGNOSIS — J690 Pneumonitis due to inhalation of food and vomit: Secondary | ICD-10-CM | POA: Diagnosis not present

## 2017-06-18 DIAGNOSIS — F411 Generalized anxiety disorder: Secondary | ICD-10-CM | POA: Diagnosis not present

## 2017-06-18 DIAGNOSIS — H7101 Cholesteatoma of attic, right ear: Secondary | ICD-10-CM | POA: Diagnosis not present

## 2017-06-18 DIAGNOSIS — T17908A Unspecified foreign body in respiratory tract, part unspecified causing other injury, initial encounter: Secondary | ICD-10-CM | POA: Diagnosis not present

## 2017-06-18 DIAGNOSIS — Z79899 Other long term (current) drug therapy: Secondary | ICD-10-CM | POA: Insufficient documentation

## 2017-06-18 DIAGNOSIS — Z6833 Body mass index (BMI) 33.0-33.9, adult: Secondary | ICD-10-CM | POA: Insufficient documentation

## 2017-06-18 DIAGNOSIS — F419 Anxiety disorder, unspecified: Secondary | ICD-10-CM | POA: Insufficient documentation

## 2017-06-18 DIAGNOSIS — Z9889 Other specified postprocedural states: Secondary | ICD-10-CM | POA: Insufficient documentation

## 2017-06-18 DIAGNOSIS — R0902 Hypoxemia: Secondary | ICD-10-CM | POA: Diagnosis not present

## 2017-06-18 DIAGNOSIS — H7191 Unspecified cholesteatoma, right ear: Secondary | ICD-10-CM | POA: Diagnosis not present

## 2017-06-18 DIAGNOSIS — Z818 Family history of other mental and behavioral disorders: Secondary | ICD-10-CM | POA: Diagnosis not present

## 2017-06-18 DIAGNOSIS — H7190 Unspecified cholesteatoma, unspecified ear: Secondary | ICD-10-CM

## 2017-06-18 DIAGNOSIS — H90A11 Conductive hearing loss, unilateral, right ear with restricted hearing on the contralateral side: Secondary | ICD-10-CM | POA: Diagnosis not present

## 2017-06-18 DIAGNOSIS — F329 Major depressive disorder, single episode, unspecified: Secondary | ICD-10-CM | POA: Diagnosis not present

## 2017-06-18 DIAGNOSIS — F32A Depression, unspecified: Secondary | ICD-10-CM

## 2017-06-18 HISTORY — DX: Unspecified cholesteatoma, unspecified ear: H71.90

## 2017-06-18 LAB — I-STAT CHEM 8, ED
BUN: 12 mg/dL (ref 6–20)
CHLORIDE: 101 mmol/L (ref 101–111)
Calcium, Ion: 1.09 mmol/L — ABNORMAL LOW (ref 1.15–1.40)
Creatinine, Ser: 1 mg/dL (ref 0.61–1.24)
Glucose, Bld: 128 mg/dL — ABNORMAL HIGH (ref 65–99)
HCT: 47 % (ref 39.0–52.0)
Hemoglobin: 16 g/dL (ref 13.0–17.0)
Potassium: 4.5 mmol/L (ref 3.5–5.1)
SODIUM: 138 mmol/L (ref 135–145)
TCO2: 25 mmol/L (ref 0–100)

## 2017-06-18 MED ORDER — FLUOXETINE HCL 20 MG PO CAPS
40.0000 mg | ORAL_CAPSULE | Freq: Every day | ORAL | Status: DC
  Administered 2017-06-19: 40 mg via ORAL
  Filled 2017-06-18: qty 2

## 2017-06-18 MED ORDER — FLUOXETINE HCL 40 MG PO CAPS: 40.0000 mg | ORAL_CAPSULE | Freq: Every day | ORAL | Status: DC

## 2017-06-18 NOTE — ED Triage Notes (Signed)
Pt here from surgical center for possible aspiration after surgery , pt arrived alert and oriented , pt placed on RA  sats 96 %,

## 2017-06-18 NOTE — ED Notes (Signed)
Dinner tray ordered; regular diet 

## 2017-06-18 NOTE — ED Notes (Signed)
Report attempted. RN to call back.

## 2017-06-18 NOTE — H&P (Addendum)
Family Medicine Teaching [facility] Ad[facility] History and Physical Service Pager: [phone]  Patient name: Arthur Dodson Medical record number: [id number] Date of birth: Dec 20, 1990 Age: 26 y.o. Gender: male  Primary Care Provider: Patient, No Pcp Per Consultants: none Code Status: FULL   Chief Complaint: aspiration pneumonia  Assessment and Plan: Arthur Lenisnthony Curless is a 26 y.o. male with PMH of cholesteotoma, anxiety, depression presenting with concern for aspiration of gastric contents post-procedure. He was having a tympanoplasty performed for a cholesteotoma and vomited up bloody gastric contents which subsequently went down the LMA. He initially required 10L [place] but was weaned down to room air. He is currently satting in 90s on room air with no shortness of breath. Xray was performed which revealed no consolidations consistent with pneumonia. He did not receive any antibiotics en route or in ED. No white count obtained. Afebrile, slight cough but no bloody sputum.   Aspiration Currently satting well on room air. Scattered wheezes bilaterally, no concern for pneumonia on chest xray. While xray is normal abnormal findings can be delayed with a short time after aspiration. No symptoms aside from cough which is not producing any sputum. Complaining of sore throat which is most likely from intubation. Will keep overnight for obs to monitor respiratory status. No indication for antibiotics. Eating burger in ed with no other s/s of aspiration. Ok for regular diet. - admit for observation to Dr. Pollie MeyerMcintyre - incentive spirometry - monitor o2 sats, monitor breathing clinically - Will consider o2 if respiratory status declines - no indication for antibiotics at present - repeat CXR in am  Cholesteatoma Patient was undergoing tympanoplasty and reconstruction. He aspirated while under anesthesia. Per reports the surgery was successfully completed. Patient is to place cotton packing infused with  neosporin daily into left ear canal. He is to follow up with surgeon in 2 weeks. - continue packing ear with neosporin daily - keep follow up appointment with ent surgeon  Anxiety/depression Patient with past diagnosis of anxiety/depression. Taken prozac 40mg  at home. Will continue that medication here. - continue prozac 40mg  daily  FEN/GI: regular diet Prophylaxis: SCDs   Disposition: observe on med-surg   History of Present Illness:  Arthur Lenisnthony Stiggers is a [age] y.o. male presenting with possible aspiration event after outpatient surgery today. Per reports, patient had aspiration of gastric contents during the procedure that appeared like coffee grounds per report. He was placed on 10L Nasal cannula initially in PACU. When he woke up, felt normal with a mild sore throat. At this point he was satting in 90s on RA. [facility]. He reports no shortness of breath in the ed. Prior to his surgery he did not eat or drink anything after 10pm the night before. He still has a mild cough. Denies any bloody sputum.  He was to have a cholesteotoma excised with ear drum reconstruction. Per reports from the patient's mother the procedure was finished when the aspiration event happened. He was told to pack his ear with cotton infused with neosporin daily and follow up with ent in 2 weeks.  Symptoms of the cholesteatoma were decreased hearing and some pain in the right ear. He did have some discharge from the ear but that resolved a couple of months ago. No bleeding in ear. Denies history of ulcers or GERD.  Denies fevers, chills, sweats, nausea, vomiting, diarrhea prior to procedure.   In the ED, VSS, CXR without acute findings.   Review Of Systems: Per HPI  ROS  Patient Active Problem List   Diagnosis Date Noted  . Aspiration into airway 06/18/2017  . Moderate recurrent major depression (HCC) 02/23/2017    Class: Chronic    Past Medical History: Past Medical History:   Diagnosis Date  . Anxiety   . Depression     Past Surgical History: History reviewed. No pertinent surgical history.  Social History: Social History  Substance Use Topics  . Smoking status: Never Smoker  . Smokeless tobacco: Never Used  . Alcohol use Less than once per month   Additional social history: lives with mom, roommate  Please also refer to relevant sections of EMR.  Family History: Family History  Problem Relation Age of Onset  . Depression Mother     Allergies and Medications: No Known Allergies No current facility-administered medications on file prior to encounter.    Current Outpatient Prescriptions on File Prior to Encounter  Medication Sig Dispense Refill  . FLUoxetine (PROZAC) 40 MG capsule Take 1 capsule (40 mg total) by mouth daily. 90 capsule 1    Objective: BP 121/68   Pulse 85   Temp 99 F (37.2 C) (Oral)   Resp 11   SpO2 95%  Exam: General: no acute distress, resting comfortably, AOx4 Eyes: EOMI, PERRl ENTM: left ear normal ear canal and tympanic membrane, Right ear packed with cotton gauze, slightly bloody. Good dentition.  Neck: supple, no lymphadenopathy Cardiovascular: rrr, no murmurs, gallops, rubs appreciated. Palpable PT/DP bilaterally, palpable radial pulse bilaterally Respiratory: scattered wheezes in lungs bilaterally, no pleuritic chest pain Gastrointestinal: soft, non-tender, non-distended. MSK: able to move all four extremities, 5/5 strength BUE, BLE Derm: warm, dry, no rashes noted Neuro: AOx4, 5/5 strength all muscle groups BLE/BUE Psych: appropriate  Labs and Imaging: CBC BMET   Recent Labs Lab 06/18/17 1520  HGB 16.0  HCT 47.0    Recent Labs Lab 06/18/17 1520  NA 138  K 4.5  CL 101  BUN 12  CREATININE 1.00  GLUCOSE 128Myrene Buddy, MD 06/18/2017, 6:23 PM PGY-1, [facility] Family Medicine FPTS Intern pager: [phone], text pages welcome  FPTS Upper-Level Resident Addendum  I have  independently interviewed and examined the patient. I have discussed the above with the original author and agree with their documentation. My edits for correction/addition/clarification are in blue. Please see also any attending notes.   Loni Muse, MD PGY-2, [place] [facility] FPTS Service pager: [phone] (text pages welcome through [facility])

## 2017-06-18 NOTE — ED Notes (Signed)
Patient transported to X-ray 

## 2017-06-18 NOTE — ED Provider Notes (Signed)
[facility]-EMERGENCY DEPT Provider Note   CSN: [id number] Arrival date & time: 06/18/17  1349     History   Chief Complaint Chief Complaint  Patient presents with  . Shortness of Breath    HPI Arthur Dodson is a 26 y.o. male.  The history is provided by the patient. No language interpreter was used.   Arthur Dodson is a 26 y.o. male who presents to the Emergency Department complaining of aspiration.  He presents from the surgery Center following an aspiration event. He was under general anesthesia with an LMA for tympanoplasty when the anesthesiologist there is some gastric contents coming up through the LMA and some possible coffee-ground material. He was temporarily intubated and given albuterol treatment through the ET tube as well as 12 mg of Decadron and 10 g of Reglan. He was subsequently extubated when he awoke from anesthesia and placed on facemask 10 L oxygen. He was satting around 90% on 10 L and referred to the emergency department for further monitoring. On ED arrival patient reports mild sore throat and otherwise feeling well. He does endorse some reflux and indigestion type symptoms that are intermittent indolent nature. He denies any black or bloody stools.   Past Medical History:  Diagnosis Date  . Anxiety   . Depression     Patient Active Problem List   Diagnosis Date Noted  . Moderate recurrent major depression (HCC) 02/23/2017    Class: Chronic    History reviewed. No pertinent surgical history.     Home Medications    Prior to Admission medications   Medication Sig Start Date End Date Taking? Authorizing Provider  azithromycin (ZITHROMAX) 250 MG tablet  04/18/17   [provider]  FLUoxetine (PROZAC) 40 MG capsule Take 1 capsule (40 mg total) by mouth daily. 04/19/17 04/19/18  Burnard LeighEksir, Alexander Arya, MD  fluticasone (FLONASE) 50 MCG/ACT nasal spray Place 2 sprays into both nostrils daily. Patient not taking: Reported on 04/19/2017 09/02/16   McVey,  Madelaine BhatElizabeth Whitney, PA-C    Family History Family History  Problem Relation Age of Onset  . Depression Mother     Social History Social History  Substance Use Topics  . Smoking status: Never Smoker  . Smokeless tobacco: Never Used  . Alcohol use No     Allergies   Patient has no known allergies.   Review of Systems Review of Systems  All other systems reviewed and are negative.    Physical Exam Updated Vital Signs BP 135/87 (BP Location: Right Arm)   Pulse 98   Temp 99 F (37.2 C) (Oral)   Resp 13   SpO2 96%   Physical Exam  Constitutional: He is oriented to person, place, and time. He appears well-developed and well-nourished.  HENT:  Head: Normocephalic and atraumatic.  Dressing in the right ear  Cardiovascular: Normal rate and regular rhythm.   No murmur heard. Pulmonary/Chest: Effort normal and breath sounds normal. No respiratory distress.  Abdominal: Soft. There is no tenderness. There is no rebound and no guarding.  Musculoskeletal: He exhibits no edema or tenderness.  Neurological: He is alert and oriented to person, place, and time.  Skin: Skin is warm and dry. There is pallor.  Psychiatric: He has a normal mood and affect. His behavior is normal.  Nursing note and vitals reviewed.    ED Treatments / Results  Labs (all labs ordered are listed, but only abnormal results are displayed) Labs Reviewed - No data to display  EKG  EKG Interpretation  None       Radiology No results found.  Procedures Procedures (including critical care time)  Medications Ordered in ED Medications - No data to display   Initial Impression / Assessment and Plan / ED Course  I have reviewed the triage vital signs and the nursing notes.  Pertinent labs & imaging results that were available during my care of the patient were reviewed by me and considered in my medical decision making (see chart for details).     Patient here following an aspiration event.  He complains of mild sore throat and feeling weak but otherwise well. He has no respiratory distress on examination. His oxygen sats are between 90 and 94% on room air. He has no respiratory distress. Plan to admit for observation given aspiration event. No evidence of bacterial infection at this time.  Final Clinical Impressions(s) / ED Diagnoses   Final diagnoses:  Aspiration into airway    New Prescriptions New Prescriptions   No medications on file     Tilden Fossa, MD 06/19/17 [phone]

## 2017-06-18 NOTE — Progress Notes (Signed)
Arthur Lenisnthony Prevo [id number] Admitted to [id number]: 06/18/2017 9:05 PM Attending Provider: Latrelle DodrillMcIntyre, Brittany J, MD    Arthur Dodson is a 26 y.o. male patient admitted from ED awake, alert  & orientated  X 3,  No Order, VSS - Blood pressure 114/75, pulse (!) 110, temperature 99.3 F (37.4 C), temperature source Oral, resp. rate 18, height 5\' 8"  (1.727 m), weight 99.2 kg (218 lb 11.2 oz), SpO2 95 %.RA, no c/o shortness of breath, no c/o chest pain, no distress noted.    IV site WDL:  with a transparent dsg that's clean dry and intact.  Allergies:  No Known Allergies   Past Medical History:  Diagnosis Date  . Anxiety   . Cholesteatoma   . Depression     History:  obtained from patient  Pt orientation to unit, room and routine. Information packet given to patient.  Admission INP armband ID verified with patient, and in place. SR up x 2, fall risk assessment complete with Patient  verbalizing understanding of risks associated with falls. Pt verbalizes an understanding of how to use the call bell and to call for help before getting out of bed.  Skin, clean-dry- intact without evidence of bruising, or skin tears.   No evidence of skin break down noted on exam.   Will cont to monitor and assist as needed.  Elisha PonderFaucette, Nori Poland Nicole, RN 06/18/2017 9:05 PM

## 2017-06-18 NOTE — ED Notes (Signed)
Sats 94-95 % while ambulating

## 2017-06-18 NOTE — ED Notes (Signed)
Second attempt made for report, secretary states RN is on contact room.

## 2017-06-19 ENCOUNTER — Observation Stay (HOSPITAL_COMMUNITY): Payer: BLUE CROSS/BLUE SHIELD

## 2017-06-19 DIAGNOSIS — T17908A Unspecified foreign body in respiratory tract, part unspecified causing other injury, initial encounter: Secondary | ICD-10-CM | POA: Diagnosis not present

## 2017-06-19 DIAGNOSIS — F411 Generalized anxiety disorder: Secondary | ICD-10-CM

## 2017-06-19 DIAGNOSIS — F329 Major depressive disorder, single episode, unspecified: Secondary | ICD-10-CM

## 2017-06-19 DIAGNOSIS — H7190 Unspecified cholesteatoma, unspecified ear: Secondary | ICD-10-CM | POA: Diagnosis not present

## 2017-06-19 DIAGNOSIS — Z01818 Encounter for other preprocedural examination: Secondary | ICD-10-CM | POA: Diagnosis not present

## 2017-06-19 DIAGNOSIS — F32A Depression, unspecified: Secondary | ICD-10-CM

## 2017-06-19 LAB — HIV ANTIBODY (ROUTINE TESTING W REFLEX): HIV Screen 4th Generation wRfx: NONREACTIVE

## 2017-06-19 MED ORDER — AMOXICILLIN-POT CLAVULANATE 875-125 MG PO TABS: 1.0000 | ORAL_TABLET | Freq: Two times a day (BID) | ORAL | 0 refills | Status: AC

## 2017-06-19 MED ORDER — BACITRACIN-NEOMYCIN-POLYMYXIN OINTMENT TUBE
TOPICAL_OINTMENT | Freq: Once | CUTANEOUS | Status: AC
Start: 2017-06-19 — End: 2017-06-19
  Administered 2017-06-19: 05:00:00 via TOPICAL
  Filled 2017-06-19: qty 14.17

## 2017-06-19 NOTE — Care Management Note (Signed)
Case Management Note  Patient Details  Name: Arthur Dodson MRN: [id number] Date of Birth: October 18, 1991  Subjective/Objective:                 Patient with order to DC to home today. Chart reviewed. No Home Health or Equipment needs, no unacknowledged Case Management consults or medication needs identified at the time of this note. Plan for DC to home. If needs arise today prior to discharge, please call [name] RN CM at [phone].    Action/Plan:   Expected Discharge Date:  06/19/17               Expected Discharge Plan:  Home/Self Care  In-House Referral:     Discharge planning Services  CM Consult  Post Acute Care Choice:    Choice offered to:     DME Arranged:    DME Agency:     HH Arranged:    HH Agency:     Status of Service:  Completed, signed off  If discussed at [employer] Length of Stay Meetings, dates discussed:    Additional Comments:  [name], RN 06/19/2017, 12:40 PM

## 2017-06-19 NOTE — Discharge Summary (Signed)
Family Medicine Teaching Service  Discharge Note: Attending Levert Feinstein MD  I have reviewed this patient and the patient's chart and have discussed discharge planning with the resident at the time of discharge. I agree with the discharge plan as below.    Discharge summary edited to include addition of augmentin upon discharge due to coarse breath sounds on my exam, also that patient elected to go ahead and schedule follow up with [place] at [facility] prior to discharge.     Family Medicine Teaching [facility] Discharge Summary  Patient name: Arthur Dodson Medical record number: [id number] Date of birth: 02-24-1991 Age: 26 y.o. Gender: male Date of Admission: 06/18/2017  Date of Discharge: 06/19/17 Admitting Physician: Latrelle Dodrill, MD  Primary Care Provider: Patient, No Pcp Per Consultants: none  Indication for Hospitalization: aspiration event   Discharge Diagnoses/Problem List:  Cholesteotoma - reconstructed by ENT Anxiety and depression  Disposition: home  Discharge Condition: stable  Discharge Exam:  Gen: well appearing male sitting on EOB in NAD ENTM: L ear atraumatic, R ear with clean cotton ball in external canal. MMM.  CV: RRR, no murmur Resp: CTAB, easy WOB Abd: SNTND, +BS Ext: no edema, warm Neuro: alert and oriented, cooperative, conversational  Brief Hospital Course:  Edge Mauger is a 26 y.o. male presenting with possible aspiration event after outpatient surgery today. Patient coughed and vomited during sedation with LMA in place - gastric contents appeared to go down the LMA. LMA was removed and replaced with ET tube, procedure was completed. Patient initially required 10L [place] due to desat to low 80s, but was weaned to room air on arrival to ED from outpatient surgery center. CXR in ED unremarkable. Maintained normal saturations on room air. No fever or productive cough. Given aspiration event, admitted to observe overnight for  possible worsening respiratory status. Repeat CXR on day of discharge unchanged and patient remained afebrile. He did have coarse inspiratory breath sounds on repeat exam, so he was started on a 7 day course of augmentin upon discharge. Discharged with return precautions should he develop fever or productive cough.   Issues for Follow Up:  1. Would recommend weight loss, exercise, and lifestyle modification given obesity.  2. Pain control with ibuprofen and tylenol.   Significant Procedures: cholesteatoma repair immediately prior to admission  Significant Labs and Imaging:   Recent Labs Lab 06/18/17 1520  HGB 16.0  HCT 47.0    Recent Labs Lab 06/18/17 1520  NA 138  K 4.5  CL 101  GLUCOSE 128*  BUN 12  CREATININE 1.00    Results/Tests Pending at Time of Discharge: Screening HIV   Discharge Medications:  Allergies as of 06/19/2017   No Known Allergies     Medication List    TAKE these medications   acetaminophen 325 MG tablet Commonly known as:  TYLENOL Take 325-650 mg by mouth every 6 (six) hours as needed (for pain or headaches).   amoxicillin-clavulanate 875-125 MG tablet Commonly known as:  AUGMENTIN Take 1 tablet by mouth 2 (two) times daily.   FLUoxetine 40 MG capsule Commonly known as:  PROZAC Take 1 capsule (40 mg total) by mouth daily.   ibuprofen 200 MG tablet Commonly known as:  ADVIL,MOTRIN Take 200-400 mg by mouth every 6 (six) hours as needed (for headaches or pain).       Discharge Instructions: Please refer to Patient Instructions section of EMR for full details.  Patient was counseled important signs and symptoms that should  prompt return to medical care, changes in medications, dietary instructions, activity restrictions, and follow up appointments.   Follow-Up Appointments: Follow-up Information    Garth Bignessimberlake, Kathryn, MD. Go to.   Specialty:  Family Medicine Why:  Follow up appointment on 06/25/2017 @ 3:35pm, please arrive 15 min prior to  appointment. Contact information: [address] [place] [address] [phone]          Loni MuseKate Timberlake MD PGY-2, [facility]

## 2017-06-19 NOTE — Progress Notes (Signed)
Arthur LenisAnthony Dodson to be D/C'd home per MD order.  Discussed with the patient and all questions fully answered.  VSS, Skin clean, dry and intact without evidence of skin break down, no evidence of skin tears noted. IV catheter discontinued intact. Site without signs and symptoms of complications. Dressing and pressure applied.  An After Visit Summary was printed and given to the patient.  D/c education completed with patient/family including follow up instructions, medication list, d/c activities limitations if indicated, with other d/c instructions as indicated by MD - patient able to verbalize understanding, all questions fully answered.   Patient instructed to return to ED, call 911, or call MD for any changes in condition.   Patient escorted via WC, and D/C home via private auto.  Arthur Dodson 06/19/2017 2:02 PM

## 2017-06-25 ENCOUNTER — Encounter: Payer: Self-pay | Admitting: Family Medicine

## 2017-06-25 ENCOUNTER — Ambulatory Visit (INDEPENDENT_AMBULATORY_CARE_PROVIDER_SITE_OTHER): Payer: BLUE CROSS/BLUE SHIELD | Admitting: Family Medicine

## 2017-06-25 VITALS — BP 98/78 | HR 103 | Temp 98.5°F | Ht 68.0 in | Wt 219.6 lb

## 2017-06-25 DIAGNOSIS — Z23 Encounter for immunization: Secondary | ICD-10-CM

## 2017-06-25 DIAGNOSIS — Z6830 Body mass index (BMI) 30.0-30.9, adult: Secondary | ICD-10-CM

## 2017-06-25 DIAGNOSIS — E669 Obesity, unspecified: Secondary | ICD-10-CM | POA: Insufficient documentation

## 2017-06-25 DIAGNOSIS — E6609 Other obesity due to excess calories: Secondary | ICD-10-CM | POA: Diagnosis not present

## 2017-06-25 NOTE — Patient Instructions (Signed)
It was a pleasure to see you today! Thank you for choosing [facility] for your primary care. Arthur Dodson was seen for new patient.   Our plans for today were:  Try to find 1-2 healthy habits that you can work on for the next month: ex. Decreasing sugary drinks, exercise.   You should return to our clinic to see Dr. Chanetta Marshallimberlake in 1 year for physical.   Best,  Dr. Chanetta Marshallimberlake

## 2017-06-25 NOTE — Assessment & Plan Note (Addendum)
Patient has gained weight due lack of exercise and he notes that he does not eat healthfully while at work. He also has a he drinks sweet tea and sodas frequently. He does not have any exercise habits. Recommended that he make small changes over a longer period of time in order to try to develop healthy habits. RTC to discuss this in 6months - 1 year at his request.

## 2017-06-25 NOTE — Progress Notes (Signed)
   CC: new patient  HPI Feeling well.   Referred by: no PCP  Medical history: frequent ear infections as a child, frequent strep throat as a child. Depression and anxiety - on SSRI from psych.    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 Gen: NAD, alert, cooperative, and pleasant. HEENT: NCAT, EOMI, PERRL CV: RRR, no murmur Resp: CTAB, no wheezes, non-labored Abd: SNTND, BS present, no guarding or organomegaly Ext: No edema, warm Neuro: Alert and oriented, Speech clear, No gross deficits  Assessment and plan:  Obesity Patient has gained weight due lack of exercise and he notes that he does not eat healthfully while at work. He also has a he drinks sweet tea and sodas frequently. He does not have any exercise habits. Recommended that he make small changes over a longer period of time in order to try to develop healthy habits.   Health Maintenance: TDap today.  Loni MuseKate Eugenia Eldredge, MD, PGY1 06/25/2017 4:19 PM

## 2017-06-25 NOTE — Addendum Note (Signed)
Addended by: Lamonte SakaiZIMMERMAN RUMPLE, APRIL D on: 06/25/2017 04:51 PM   Modules accepted: Orders, SmartSet

## 2017-07-20 ENCOUNTER — Encounter (HOSPITAL_COMMUNITY): Payer: Self-pay | Admitting: Psychiatry

## 2017-07-20 ENCOUNTER — Ambulatory Visit (INDEPENDENT_AMBULATORY_CARE_PROVIDER_SITE_OTHER): Payer: BLUE CROSS/BLUE SHIELD | Admitting: Psychiatry

## 2017-07-20 ENCOUNTER — Ambulatory Visit (HOSPITAL_COMMUNITY): Payer: BLUE CROSS/BLUE SHIELD | Admitting: Psychiatry

## 2017-07-20 VITALS — BP 126/72 | HR 94 | Ht 68.0 in | Wt 221.4 lb

## 2017-07-20 DIAGNOSIS — F3341 Major depressive disorder, recurrent, in partial remission: Secondary | ICD-10-CM | POA: Diagnosis not present

## 2017-07-20 DIAGNOSIS — Z818 Family history of other mental and behavioral disorders: Secondary | ICD-10-CM | POA: Diagnosis not present

## 2017-07-20 DIAGNOSIS — F5104 Psychophysiologic insomnia: Secondary | ICD-10-CM

## 2017-07-20 MED ORDER — FLUOXETINE HCL 60 MG PO TABS
60.0000 mg | ORAL_TABLET | Freq: Every day | ORAL | 1 refills | Status: DC
Start: 2017-07-20 — End: 2017-11-09

## 2017-07-20 MED ORDER — GABAPENTIN 100 MG PO CAPS: ORAL_CAPSULE | ORAL | 1 refills | Status: DC

## 2017-07-20 NOTE — Progress Notes (Signed)
BH MD/PA/NP OP Progress Note  07/20/2017 10:07 AM Ramesh Moan  MRN:  [id number]  Chief Complaint: med management Subjective: Deran Barro has had a flare in depression symptoms, particularly anxiety and insomnia over the past few months. He had a growth in his ear and had to have surgery, and a metal plate implant. This has been very difficult for him in terms of the recovery and some of the pain symptoms during the day. This is caused flare and depressive symptoms. We spent time discussing re-engaging in individual therapy and following up with this writer on a more frequent basis. He reports that he would be agreeable to this, we also discussed some FMLA support to make sure he has time to go therapy follow-ups and to group therapy if needed. He denies any suicidality or unsafe thoughts. He is not engaging in any substance abuse.  We discussed increasing Prozac to 60 mg and starting gabapentin for some of the pain and sleep difficulties he's been struggling with recently. He agrees to follow-up with Clinical research associate in 8 weeks.  Visit Diagnosis:    ICD-10-CM   1. Recurrent major depressive disorder, in partial remission (HCC) F33.41 FLUoxetine 60 MG TABS    gabapentin (NEURONTIN) 100 MG capsule    Ambulatory referral to Psychology  2. Psychophysiological insomnia F51.04 FLUoxetine 60 MG TABS    gabapentin (NEURONTIN) 100 MG capsule    Ambulatory referral to Psychology    Past Psychiatric History: See intake H&P for full details. Reviewed, with no updates at this time.   Past Medical History:  Past Medical History:  Diagnosis Date  . Anxiety   . Cholesteatoma   . Depression     Past Surgical History:  Procedure Laterality Date  . INCISION AND DRAINAGE ABSCESS     when he was 24 months old  . SCROTAL EXPLORATION     when he was 1 day old    Family Psychiatric History: See intake H&P for full details. Reviewed, with no updates at this time.   Family History:  Family History   Problem Relation Age of Onset  . Depression Mother     Social History:  Social History   Social History  . Marital status: Single    Spouse name: N/A  . Number of children: N/A  . Years of education: N/A   Social History Main Topics  . Smoking status: Never Smoker  . Smokeless tobacco: Never Used  . Alcohol use No  . Drug use: No  . Sexual activity: No   Other Topics Concern  . None   Social History Narrative  . None    Allergies: No Known Allergies  Metabolic Disorder Labs: No results found for: HGBA1C, MPG No results found for: PROLACTIN No results found for: CHOL, TRIG, HDL, CHOLHDL, VLDL, LDLCALC   Current Medications: Current Outpatient Prescriptions  Medication Sig Dispense Refill  . acetaminophen (TYLENOL) 325 MG tablet Take 325-650 mg by mouth every 6 (six) hours as needed (for pain or headaches).    Marland Kitchen FLUoxetine 60 MG TABS Take 60 mg by mouth daily. 90 tablet 1  . gabapentin (NEURONTIN) 100 MG capsule Take 1-2 times during the day, and take up to 3 capsules at night for sleep 120 capsule 1  . ibuprofen (ADVIL,MOTRIN) 200 MG tablet Take 200-400 mg by mouth every 6 (six) hours as needed (for headaches or pain).     No current facility-administered medications for this visit.     Neurologic: Headache: Negative Seizure: Negative  Paresthesias: Negative  Musculoskeletal: Strength & Muscle Tone: within normal limits Gait & Station: normal Patient leans: N/A  Psychiatric Specialty Exam: ROS  Blood pressure 126/72, pulse 94, height 5\' 8"  (1.727 m), weight 221 lb 6.4 oz (100.4 kg).Body mass index is 33.66 kg/m.  General Appearance: Casual and Well Groomed  Eye Contact:  Good  Speech:  Clear and Coherent  Volume:  Normal  Mood:  Anxious and Dysphoric  Affect:  Congruent  Thought Process:  Coherent and Goal Directed  Orientation:  Full (Time, Place, and Person)  Thought Content: Logical   Suicidal Thoughts:  No  Homicidal Thoughts:  No  Memory:   Immediate;   Fair  Judgement:  Fair  Insight:  Fair  Psychomotor Activity:  Normal  Concentration:  Attention Span: Good  Recall:  Good  Fund of Knowledge: Good  Language: Good  Akathisia:  Negative  Handed:  Right  AIMS (if indicated):  0  Assets:  Communication Skills Desire for Improvement Financial Resources/Insurance Housing Social Support Transportation Vocational/Educational  ADL's:  Intact  Cognition: WNL  Sleep:  5-6 hours    Treatment Plan Summary: Hinda Lenisnthony Leeman is a 26 year old male with a history of major depressive disorder and PTSD who presents today for medication management follow-up. He's had a flare in his mood symptoms with some recent medical and surgical issues. We discussed increasing Prozac to 60 mg and augmenting with gabapentin for sleep and pain management. He is agreeable to individual therapy referral and follow-up with writer in 8 weeks. He does not have any acute safety issues.  1. Recurrent major depressive disorder, in partial remission (HCC)   2. Psychophysiological insomnia     Increase Prozac to 60 mg daily Gabapentin 100 mg twice daily as needed for pain, and 300 mg nightly for sleep Parole to psychology  Burnard LeighAlexander Arya Aarica Wax, MD 07/20/2017, 10:07 AM

## 2017-07-29 ENCOUNTER — Other Ambulatory Visit (HOSPITAL_COMMUNITY): Payer: Self-pay | Admitting: Psychiatry

## 2017-07-29 ENCOUNTER — Encounter (HOSPITAL_COMMUNITY): Payer: Self-pay | Admitting: Psychiatry

## 2017-07-29 MED ORDER — TRAZODONE HCL 50 MG PO TABS: 50.0000 mg | ORAL_TABLET | Freq: Every evening | ORAL | 2 refills | Status: DC | PRN

## 2017-08-02 NOTE — Telephone Encounter (Signed)
Hey [place] Sink can you call [facility] about restarting IOP.  He can use the support right now.

## 2017-08-03 ENCOUNTER — Other Ambulatory Visit (HOSPITAL_COMMUNITY): Payer: BLUE CROSS/BLUE SHIELD | Attending: Psychiatry | Admitting: Psychiatry

## 2017-08-03 ENCOUNTER — Encounter (HOSPITAL_COMMUNITY): Payer: Self-pay | Admitting: Psychiatry

## 2017-08-03 DIAGNOSIS — F332 Major depressive disorder, recurrent severe without psychotic features: Secondary | ICD-10-CM | POA: Insufficient documentation

## 2017-08-03 DIAGNOSIS — Z79899 Other long term (current) drug therapy: Secondary | ICD-10-CM | POA: Insufficient documentation

## 2017-08-03 DIAGNOSIS — F331 Major depressive disorder, recurrent, moderate: Secondary | ICD-10-CM

## 2017-08-03 NOTE — Progress Notes (Signed)
Arthur Dodson is a 26 y.o. , employed, Caucasian male; who was referred per Dr. Daron Offer; treatment for worsening depressive and anxiety symptoms. Pt is well known to this writer due to recent admit in [place] (March 2018).  Multiple stressors: 1) Job Stress:  According to pt he returned to work, but started having issues with his ear.  Reports being out of work for ~ one month.  Ended up having ear surgery 06-18-17.  Returned to work two weeks after.  States his ear is very sensitive to noise.  "I had to end up leaving the first day due to the noise.  I ended up calling out four days in a row and management told me to take another leave."  Pt reports after seeing Dr. Daron Offer he returned to work and had a panic attack.  "I couldn't even drive home; a coworker had to drive me home."  2)  In 2003 father passed and pt has been mother's only support. She has been residing with patient and his roommate.  Is on total disability due to her medical issues along with depression. Voiced frustration with her not doing more to take care of her health (ie. hx of on and off of smoking cigarettes). M-GF died three months later after patient's father died. Then step grandmother sued pt's mother over the will. In Christmas 2017 he had met a 27 yo lady online, but whenever she went on Christmas break she ended all contact with him. Denies any prior psychiatric admissions or suicide attempts or gestures.  Denies SI/HI or A/V hallucinations.  Family Hx: Mother (depression). Scored 24 on the [place].  A:  Re-oriented pt to MH-IOP.  Informed Dr. Daron Offer and Dr. Laroy Apple of admit.  Encouraged support groups.  R:  Pt receptive.     Carlis Abbott, RITA, M.Ed, CNA

## 2017-08-03 NOTE — Progress Notes (Signed)
Psychiatric Initial Adult Assessment   Patient Identification: Arthur Dodson MRN:  [id number] Date of Evaluation:  08/03/2017 Referral Source: Dr Rene Kocher Chief Complaint:   Chief Complaint    Anxiety; Depression; Stress     Visit Diagnosis: major depression recurrent severe History of Present Illness:  Arthur Dodson was in this program in March and did well.  unfortunately he developed problems with his ear that resulted in surgery and afterwards overly sensitive hearing causing him to miss a lot of work.  He felt guilty about being out so much and also about not being able to keep up with the projects at work.  He got depressed again and found himself isolating. Having trouble sleeping and staying asleep, feeling sad, having no motivation, interest or energy, not finding pleasure in usual activities.  He felt bad that he let himself slip back into depression after doing so well but says now he needs help.  Stressors remain his mother who lives with him with her own set of significant mental health problems and financial issues with missing work.  The last time he tried to return to work he had a panic attack and had to be driven home and that was the last straw that brought him back here.  Associated Signs/Symptoms: Depression Symptoms:  depressed mood, anhedonia, insomnia, fatigue, difficulty concentrating, anxiety, panic attacks, disturbed sleep, (Hypo) Manic Symptoms:  Irritable Mood, Anxiety Symptoms:  Panic Symptoms, Psychotic Symptoms:  none PTSD Symptoms: none  Past Psychiatric History: IOP in March and ongoing outpatient therapy and medication management.   Previous Psychotropic Medications: Yes   Substance Abuse History in the last 12 months:  No.  Consequences of Substance Abuse: Negative  Past Medical History:  Past Medical History:  Diagnosis Date  . Anxiety   . Cholesteatoma   . Depression     Past Surgical History:  Procedure Laterality Date  . INCISION  AND DRAINAGE ABSCESS     when he was 25 months old  . SCROTAL EXPLORATION     when he was 1 day old    Family Psychiatric History: mother with depression  Family History:  Family History  Problem Relation Age of Onset  . Depression Mother     Social History:   Social History   Social History  . Marital status: Single    Spouse name: N/A  . Number of children: N/A  . Years of education: N/A   Social History Main Topics  . Smoking status: Never Smoker  . Smokeless tobacco: Never Used  . Alcohol use No  . Drug use: No  . Sexual activity: No   Other Topics Concern  . None   Social History Narrative  . None    Additional Social History: none  Allergies:  No Known Allergies  Metabolic Disorder Labs: No results found for: HGBA1C, MPG No results found for: PROLACTIN No results found for: CHOL, TRIG, HDL, CHOLHDL, VLDL, LDLCALC   Current Medications: Current Outpatient Prescriptions  Medication Sig Dispense Refill  . acetaminophen (TYLENOL) 325 MG tablet Take 325-650 mg by mouth every 6 (six) hours as needed (for pain or headaches).    Marland Kitchen FLUoxetine 60 MG TABS Take 60 mg by mouth daily. 90 tablet 1  . gabapentin (NEURONTIN) 100 MG capsule Take 1-2 times during the day, and take up to 3 capsules at night for sleep 120 capsule 1  . ibuprofen (ADVIL,MOTRIN) 200 MG tablet Take 200-400 mg by mouth every 6 (six) hours as needed (for headaches or  pain).    . traZODone (DESYREL) 50 MG tablet Take 1 tablet (50 mg total) by mouth at bedtime as needed for sleep. Take 1-2 tablet at night for sleep 60 tablet 2   No current facility-administered medications for this visit.     Neurologic: Headache: Negative Seizure: Negative Paresthesias:Negative  Musculoskeletal: Strength & Muscle Tone: within normal limits Gait & Station: normal Patient leans: N/A  Psychiatric Specialty Exam: ROS  There were no vitals taken for this visit.There is no height or weight on file to  calculate BMI.  General Appearance: Casual  Eye Contact:  Good  Speech:  Clear and Coherent  Volume:  Normal  Mood:  Anxious  Affect:  Congruent  Thought Process:  Coherent and Goal Directed  Orientation:  Full (Time, Place, and Person)  Thought Content:  Logical  Suicidal Thoughts:  No  Homicidal Thoughts:  No  Memory:  Immediate;   Good Recent;   Good Remote;   Good  Judgement:  Good  Insight:  Good  Psychomotor Activity:  Normal  Concentration:  Concentration: Good and Attention Span: Good  Recall:  Good  Fund of Knowledge:Good  Language: Good  Akathisia:  Negative  Handed:  Right  AIMS (if indicated):  0  Assets:  Communication Skills Desire for Improvement Financial Resources/Insurance Housing Leisure Time Physical Health Resilience Social Support Talents/Skills Transportation Vocational/Educational  ADL's:  Intact  Cognition: WNL  Sleep:  poor    Treatment Plan Summary: admit to IOP with daily group therapy.  continue current medications   Carolanne Grumbling, MD 8/28/201812:28 PM

## 2017-08-04 ENCOUNTER — Other Ambulatory Visit (HOSPITAL_COMMUNITY): Payer: BLUE CROSS/BLUE SHIELD | Admitting: Psychiatry

## 2017-08-04 DIAGNOSIS — F332 Major depressive disorder, recurrent severe without psychotic features: Secondary | ICD-10-CM | POA: Diagnosis not present

## 2017-08-04 DIAGNOSIS — Z79899 Other long term (current) drug therapy: Secondary | ICD-10-CM | POA: Diagnosis not present

## 2017-08-04 DIAGNOSIS — F331 Major depressive disorder, recurrent, moderate: Secondary | ICD-10-CM

## 2017-08-05 ENCOUNTER — Other Ambulatory Visit (HOSPITAL_COMMUNITY): Payer: BLUE CROSS/BLUE SHIELD | Admitting: Psychiatry

## 2017-08-05 DIAGNOSIS — F331 Major depressive disorder, recurrent, moderate: Secondary | ICD-10-CM

## 2017-08-05 DIAGNOSIS — Z79899 Other long term (current) drug therapy: Secondary | ICD-10-CM | POA: Diagnosis not present

## 2017-08-05 DIAGNOSIS — F332 Major depressive disorder, recurrent severe without psychotic features: Secondary | ICD-10-CM | POA: Diagnosis not present

## 2017-08-06 ENCOUNTER — Other Ambulatory Visit (HOSPITAL_COMMUNITY): Payer: BLUE CROSS/BLUE SHIELD | Admitting: Psychiatry

## 2017-08-06 DIAGNOSIS — Z79899 Other long term (current) drug therapy: Secondary | ICD-10-CM | POA: Diagnosis not present

## 2017-08-06 DIAGNOSIS — F332 Major depressive disorder, recurrent severe without psychotic features: Secondary | ICD-10-CM | POA: Diagnosis not present

## 2017-08-06 DIAGNOSIS — F331 Major depressive disorder, recurrent, moderate: Secondary | ICD-10-CM

## 2017-08-06 NOTE — Progress Notes (Signed)
    Daily Group Progress Note  Program: IOP  Group Time: 9:00-12:00   Participation Level: Active   Behavioral Response: Sharing   Type of Therapy:  Group Therapy   Summary of Progress: Pt came in at end of group to join for grief and loss talk with Chaplain.  Pt said that he is grieving the loss of who he was "supposed to be".  Pt said that after his father's death, he was "supposed to be the positive one" and to take care of his mother.  Pt expressed having a lot of self-criticism and invalidating thoughts about himself.  Counselor recommended Kristin Neff's self-compassion TedTalk.  Shaune PollackBrown, Anureet Bruington B, LPC

## 2017-08-10 ENCOUNTER — Other Ambulatory Visit (HOSPITAL_COMMUNITY): Payer: BLUE CROSS/BLUE SHIELD | Attending: Psychiatry | Admitting: Psychiatry

## 2017-08-10 DIAGNOSIS — F332 Major depressive disorder, recurrent severe without psychotic features: Secondary | ICD-10-CM | POA: Diagnosis not present

## 2017-08-10 DIAGNOSIS — F331 Major depressive disorder, recurrent, moderate: Secondary | ICD-10-CM

## 2017-08-10 NOTE — Progress Notes (Signed)
    Daily Group Progress Note  Program: IOP  Group Time: 9:00-12:00   Participation Level: Active   Behavioral Response:  Appropriate   Type of Therapy:  Group Therapy   Summary of Progress:  Pt presented as engaged.  Pt said that he has lost people close to him by suicide, which has been a struggle to work through.  Pt said that he has black-and-white thinking and that he wants to develop a greater connection between his head and his heart.  Other group members suggested to pt that he try getting outside and exercising as a way to clear his mind and break away from some of the self-criticism he experiences.  Shaune PollackBrown, Jennifer B, LPC

## 2017-08-10 NOTE — Progress Notes (Signed)
    Daily Group Progress Note  Program: IOP  Group Time: 9:00-12:00   Participation Level: Active   Behavioral Response:  Appropriate   Type of Therapy:  Group Therapy   Summary of Progress:  Pt presented as engaged.  Pt participated in guided meditation and said that the practice of not judging his thoughts was difficult for him, because "that is just who I am".  Counselor challenged pt to reframe that statement as "this is a judgmental thought I'm having, but this thought does not define me".  Counselor asked about affirming or validating environments that pt may have in his life; pt said that he usually has to provide validation for himself.  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  Shaune PollackBrown, Jennifer B, LPC

## 2017-08-10 NOTE — Progress Notes (Signed)
    Daily Group Progress Note  Program: IOP  Group Time: 9:00-12:00  Participation Level: Active  Behavioral Response: Appropriate  Type of Therapy:  Group Therapy  Summary of Progress: Pt. Continues to present as talkative, engaged in the group process, insightful. Pt. Connected with other group member around struggles that she is having with her adolescent son and shared from his experience of communication challenges with his mother. Pt. Encouraged the Pt. To try to view situations from her son's perspective. Pt. Discussed briefly delayed grief related to the loss of his father at a young age and that his father did not get to see him realize his dream of becoming an actor. Pt. Continues to be challenged by being stuck in his head and is reluctant to try new experiences that would subject him to negative feedback from others; however, Pt. Is open to suggestions from the group for new experiences that would "get him out of his head and into his heart".      Shaune PollackBrown, Finleigh Cheong B, LPC

## 2017-08-11 ENCOUNTER — Other Ambulatory Visit (HOSPITAL_COMMUNITY): Payer: BLUE CROSS/BLUE SHIELD | Admitting: Psychiatry

## 2017-08-11 DIAGNOSIS — F332 Major depressive disorder, recurrent severe without psychotic features: Secondary | ICD-10-CM | POA: Diagnosis not present

## 2017-08-11 DIAGNOSIS — F331 Major depressive disorder, recurrent, moderate: Secondary | ICD-10-CM

## 2017-08-11 NOTE — Progress Notes (Signed)
    Daily Group Progress Note  Program: IOP  Group Time: 9:00-12:00  Participation Level: Active  Behavioral Response: Appropriate  Type of Therapy:  Group Therapy  Summary of Progress: Pt. Presents as anxious, engaged in the group process. Pt. Reported to the group that he does not feel that he is getting better, that he feels more anxious than yesterday. Pt. Was asked what he believes to be the cause of his anxiety and he states that he goes home and he over thinks the group process. Pt.continues to be reluctant to commit to making specific behavior changes related to his interests in the art or theater or engaging in significant relationships. Pt. Is aware that his reluctance is related to his fear of failure. Pt. Was able to make a connection of another group member who shares his overwhelming fear of failure.     Shaune PollackBrown, Jennifer B, LPC

## 2017-08-12 ENCOUNTER — Other Ambulatory Visit (HOSPITAL_COMMUNITY): Payer: BLUE CROSS/BLUE SHIELD | Admitting: Psychiatry

## 2017-08-12 DIAGNOSIS — F332 Major depressive disorder, recurrent severe without psychotic features: Secondary | ICD-10-CM | POA: Diagnosis not present

## 2017-08-12 DIAGNOSIS — F331 Major depressive disorder, recurrent, moderate: Secondary | ICD-10-CM

## 2017-08-12 NOTE — Progress Notes (Signed)
    Daily Group Progress Note  Program: IOP  Group Time: 9:00-12:00  Participation Level: Active  Behavioral Response: Appropriate  Type of Therapy:  Group Therapy  Summary of Progress: Pt. Presents as quiet, engaged in the group process. Pt. Shared that he had trouble sleeping last night. Pt. Shared with the group that he has been prescribed trazadone to help with sleep, but has been hesitant to take because he wants to focus on his thoughts. Pt. Was encouraged to take as managing the sleep would help with underlying depression. Pt. Has connected with other Pt. Around theme of overthinking his recovery and reluctance to engaging in new behaviors that will assist with recovery due to fear of failure. Pt. Participated in yoga therapy with Forde RadonLeanne Yates, LPC.     Shaune PollackBrown, Jennifer B, LPC

## 2017-08-12 NOTE — Progress Notes (Signed)
    Daily Group Progress Note  Program: IOP  Group Time: 9:00-12:00   Participation Level: Active   Behavioral Response:  Appropriate   Type of Therapy:  Group Therapy   Summary of Progress:  Pt presented as engaged.  Counselor suggested that pt move from "thinking and feeling mode" to "doing" mode.  For example, pt mentioned that he went to the cat caf over the weekend, which was an action.  Counselors suggested that exposing himself to situations like this may help to gradually reduce social anxiety and bring the pt out of his own thoughts.  Pt said that sometimes in the past, others have said his interests are stupid.  Counselors challenged pt to look for clues as to who he is and what he enjoys, regardless of what others may think of him.  Shaune PollackBrown, Jennifer B, LPC

## 2017-08-13 ENCOUNTER — Other Ambulatory Visit (HOSPITAL_COMMUNITY): Payer: BLUE CROSS/BLUE SHIELD | Admitting: Psychiatry

## 2017-08-13 DIAGNOSIS — F331 Major depressive disorder, recurrent, moderate: Secondary | ICD-10-CM

## 2017-08-13 DIAGNOSIS — F332 Major depressive disorder, recurrent severe without psychotic features: Secondary | ICD-10-CM | POA: Diagnosis not present

## 2017-08-16 ENCOUNTER — Other Ambulatory Visit (HOSPITAL_COMMUNITY): Payer: BLUE CROSS/BLUE SHIELD | Admitting: Psychiatry

## 2017-08-16 DIAGNOSIS — F331 Major depressive disorder, recurrent, moderate: Secondary | ICD-10-CM

## 2017-08-16 DIAGNOSIS — F332 Major depressive disorder, recurrent severe without psychotic features: Secondary | ICD-10-CM | POA: Diagnosis not present

## 2017-08-17 ENCOUNTER — Other Ambulatory Visit (HOSPITAL_COMMUNITY): Payer: BLUE CROSS/BLUE SHIELD | Admitting: Psychiatry

## 2017-08-17 DIAGNOSIS — F332 Major depressive disorder, recurrent severe without psychotic features: Secondary | ICD-10-CM | POA: Diagnosis not present

## 2017-08-17 DIAGNOSIS — F331 Major depressive disorder, recurrent, moderate: Secondary | ICD-10-CM

## 2017-08-18 ENCOUNTER — Other Ambulatory Visit (HOSPITAL_COMMUNITY): Payer: BLUE CROSS/BLUE SHIELD | Admitting: Psychiatry

## 2017-08-18 DIAGNOSIS — F332 Major depressive disorder, recurrent severe without psychotic features: Secondary | ICD-10-CM | POA: Diagnosis not present

## 2017-08-18 DIAGNOSIS — F331 Major depressive disorder, recurrent, moderate: Secondary | ICD-10-CM

## 2017-08-18 NOTE — Progress Notes (Signed)
    Daily Group Progress Note  Program: IOP  Group Time: 9:00-12:00   Participation Level: Active   Behavioral Response:  Appropriate, Agitated   Type of Therapy:  Group Therapy   Summary of Progress:  Pt presented as engaged and a bit frustrated.  Pt said that he was concerned how he was perceived the previous day as he was interacting with another pt.  Pt worried that both during this session and the previous session that he came across as yelling or aggressive.  Counselors and other pts gave feedback that pt did not come across that way, but rather passionate.  At times, pt had pressured speech and expressed having an endless stream of racing thoughts.  Pt said "I just want to be happy".  Pt said [place] [facility] [place].  Shaune PollackBrown, Kaylinn Dedic B, LPC

## 2017-08-18 NOTE — Progress Notes (Signed)
    Daily Group Progress Note  Program: IOP  Group Time: 9:00-12:00  Participation Level: Active  Behavioral Response: Appropriate  Type of Therapy:  Group Therapy  Summary of Progress: Pt. Presented as calm, talkative, engaged in the group process. Pt. Participated in medication management group with the pharmacist. Pt. Shared that he had a better weekend than he expected. Pt. Reported that he got a haircut, he made plans to go to a card store with a friend and when it did not work out, he made plans to go the [employer] and had a good time. Pt. Participated in discussion about understanding the purpose of anger in our lives and offered to the group that without anger, we would allow ourselves to be comfortable with abuse and inhumanity.      Shaune PollackBrown, Jennifer B, LPC

## 2017-08-19 ENCOUNTER — Other Ambulatory Visit (HOSPITAL_COMMUNITY): Payer: BLUE CROSS/BLUE SHIELD | Admitting: Psychiatry

## 2017-08-19 DIAGNOSIS — F331 Major depressive disorder, recurrent, moderate: Secondary | ICD-10-CM

## 2017-08-19 DIAGNOSIS — F332 Major depressive disorder, recurrent severe without psychotic features: Secondary | ICD-10-CM | POA: Diagnosis not present

## 2017-08-19 NOTE — Progress Notes (Signed)
    Daily Group Progress Note  Program: IOP  Group Time: 9:00-12:00   Participation Level: Active   Behavioral Response:  Appropriate, Sharing   Type of Therapy:  Group Therapy   Summary of Progress:  Pt presented as engaged and a bit frustrated.  Pt provided appropriate feedback for other group members.  Pt shared that he cheated on his girlfriend when he was 89 years old and still feels guilt over that situation and believes he does not deserve a healthy relationship as a result.  Pt participated in cognitive restructuring activity and identified his father's death as a painful event.  Pt's thoughts and feelings around the event seem to be most troubling surrounding beliefs that he should have been able to care for his mother better in the wake of his father's death.  Pt expressed concern to counselors after group about how he presented and if he should have responded differently to other group members.  Counselors assured pt that his behavior in group was appropriate.  Shaune PollackBrown, Mattalynn Crandle B, LPC

## 2017-08-19 NOTE — Progress Notes (Signed)
Patient ID: Arthur Dodson, male   DOB: 09/12/1991, 26 y.o.   MRN: [id number]  [facility]  Patient:  Arthur Dodson DOB:  09/12/1991  Date of Admission: 08/03/2017  Date of Discharge:08/19/2017   Reason for Admission:depression  IOP Course:attended and participated with good result.  Feels much better able to accept himself and not have to please others, less depressed and much more optimistic  Mental Status at Discharge:no suicidal thoughts  Diagnosis: severe major depression recurrent without psychosis  Level of Care:  IOP     Follow-up recommendations:  Has appointmants with his therapist and psychiatrist  Comments:  Should do well with what he has learned  The patient received suicide prevention pamphlet:  Yes   Carolanne GrumblingGerald Johna Kearl, MD

## 2017-08-19 NOTE — Progress Notes (Signed)
Arthur Dodson is a 26 y.o. , employed, Caucasian male; who was referred per Dr. Daron Offer; treatment for worsening depressive and anxiety symptoms. Pt is well known to this writer due to recent admit in [place] (March 2018).  Multiple stressors: 1) Job Stress:  According to pt he returned to work, but started having issues with his ear.  Reports being out of work for ~ one month.  Ended up having ear surgery 06-18-17.  Returned to work two weeks after.  States his ear is very sensitive to noise.  "I had to end up leaving the first day due to the noise.  I ended up calling out four days in a row and management told me to take another leave."  Pt reports after seeing Dr. Daron Offer he returned to work and had a panic attack.  "I couldn't even drive home; a coworker had to drive me home."  2)  In 2003 father passed and pt has been mother's only support. She has been residing with patient and his roommate.  Is on total disability due to her medical issues along with depression. Voiced frustration with her not doing more to take care of her health (ie. hx of on and off of smoking cigarettes). M-GF died three months later after patient's father died. Then step grandmother sued pt's mother over the will. In Christmas 2017 he had met a 27 yo lady online, but whenever she went on Christmas break she ended all contact with him. Deniesany prior psychiatric admissions or suicide attempts or gestures.  Continues to deny SI/HI or A/V hallucinations.  Family Hx: Mother (depression). Pt completed MH-IOP today.  Reports feeling more hopeful and positive.  "I know my limitations and I need to be ok with my limitations."  States he is working on validating himself better.  "I need more courage in my life.  I need something to fulfill me."  States he accomplished his goals of "learning better coping skills, connecting his heart and mind, and communicating better with self." According to pt, his short term disability was denied.   Apparently, pt hadn't signed up for the benefit.  A:  D/C today.  F/U with Dr. Gaynell Face on 08-25-17 @ 10 a.m and Dr. Daron Offer on 09-14-17 @ 10 a.m.Marland Kitchen Strongly recommended pt to f/u with [facility] for groups.  RTW on 08-30-17 without any restrictions.  R:  Pt receptive.      Carlis Abbott, RITA, M.Ed, CNA

## 2017-08-19 NOTE — Patient Instructions (Signed)
D:  Pt successfully completed MH-IOP today.  A:  Discharge today.  Follow up with Dr. Dewayne HatchMendelson 08-25-17 @ 10 a.m and Dr. Rene KocherEksir on 09-14-17 @ 10 a.m.  Encouraged support groups.  RTW on 08-30-17; without any restrictions.  R:  Pt receptive.

## 2017-08-20 ENCOUNTER — Other Ambulatory Visit (HOSPITAL_COMMUNITY): Payer: BLUE CROSS/BLUE SHIELD | Admitting: Psychiatry

## 2017-08-20 NOTE — Progress Notes (Signed)
    Daily Group Progress Note  Program: IOP  Group Time: 9:00-12:00   Participation Level: Active   Behavioral Response:  Appropriate, Sharing   Type of Therapy:  Group Therapy   Summary of Progress:  Pt presented as engaged and positive.  Pt provided appropriate feedback to other group members.  Counselors and group members provided pt with affirmation and gratitude for his time in group; pt expressed appreciation for the group process and hopefulness about his future.  Counselor provided pt with contact for coordinators of a local theatre production for him to look into.  Pt said that the previous day, he spoke to many people through a web cam on an internet game, despite worries that he may fail, which was a big accomplishment for him.  Pt heard information about [employer].    Shaune Pollack, LPC

## 2017-08-23 ENCOUNTER — Other Ambulatory Visit (HOSPITAL_COMMUNITY): Payer: BLUE CROSS/BLUE SHIELD

## 2017-08-23 NOTE — Progress Notes (Signed)
    Daily Group Progress Note  Program: IOP  Group Time: 9:00-12:00   Participation Level: Active   Behavioral Response:  Appropriate, Sharing   Type of Therapy:  Group Therapy   Summary of Progress:  Pt presented as engaged and a bit frustrated.  Pt said that he had a moment where he felt ashamed about spending money on eating out when he could have used the money more responsibly, since he has been out of work.  Counselors suggested a reframe that the utility of pt's frustration may serve as an indicator that may prompt him to spend his money how he feels he should more in the future.  Pt provided appropriate feedback for other group members.  Shaune Pollack, LPC

## 2017-08-24 ENCOUNTER — Other Ambulatory Visit (HOSPITAL_COMMUNITY): Payer: BLUE CROSS/BLUE SHIELD

## 2017-08-25 ENCOUNTER — Ambulatory Visit (INDEPENDENT_AMBULATORY_CARE_PROVIDER_SITE_OTHER): Payer: BLUE CROSS/BLUE SHIELD | Admitting: Clinical

## 2017-08-25 DIAGNOSIS — F331 Major depressive disorder, recurrent, moderate: Secondary | ICD-10-CM | POA: Diagnosis not present

## 2017-08-26 ENCOUNTER — Other Ambulatory Visit (HOSPITAL_COMMUNITY): Payer: BLUE CROSS/BLUE SHIELD

## 2017-08-27 ENCOUNTER — Other Ambulatory Visit (HOSPITAL_COMMUNITY): Payer: BLUE CROSS/BLUE SHIELD

## 2017-08-30 ENCOUNTER — Other Ambulatory Visit (HOSPITAL_COMMUNITY): Payer: BLUE CROSS/BLUE SHIELD

## 2017-08-31 ENCOUNTER — Other Ambulatory Visit (HOSPITAL_COMMUNITY): Payer: BLUE CROSS/BLUE SHIELD

## 2017-08-31 ENCOUNTER — Ambulatory Visit: Payer: BLUE CROSS/BLUE SHIELD | Admitting: Clinical

## 2017-08-31 ENCOUNTER — Ambulatory Visit (INDEPENDENT_AMBULATORY_CARE_PROVIDER_SITE_OTHER): Payer: BLUE CROSS/BLUE SHIELD | Admitting: Clinical

## 2017-08-31 DIAGNOSIS — F331 Major depressive disorder, recurrent, moderate: Secondary | ICD-10-CM

## 2017-09-01 ENCOUNTER — Ambulatory Visit (INDEPENDENT_AMBULATORY_CARE_PROVIDER_SITE_OTHER): Payer: BLUE CROSS/BLUE SHIELD | Admitting: Internal Medicine

## 2017-09-01 ENCOUNTER — Other Ambulatory Visit (HOSPITAL_COMMUNITY): Payer: BLUE CROSS/BLUE SHIELD

## 2017-09-01 DIAGNOSIS — K529 Noninfective gastroenteritis and colitis, unspecified: Secondary | ICD-10-CM

## 2017-09-01 DIAGNOSIS — R0981 Nasal congestion: Secondary | ICD-10-CM | POA: Diagnosis not present

## 2017-09-01 MED ORDER — LORATADINE 10 MG PO CAPS: ORAL_CAPSULE | ORAL | 0 refills | Status: DC

## 2017-09-01 MED ORDER — ONDANSETRON HCL 4 MG PO TABS: 4.0000 mg | ORAL_TABLET | Freq: Three times a day (TID) | ORAL | 0 refills | Status: DC | PRN

## 2017-09-01 MED ORDER — FLUTICASONE PROPIONATE 50 MCG/ACT NA SUSP: 2.0000 | Freq: Every day | NASAL | 2 refills | Status: DC

## 2017-09-01 NOTE — Patient Instructions (Addendum)
I want you to use Zofran as needed for nausea. You should begin to feel better in the next couple of days. Please continue Claritin and Flonase as need for the nasal congestion

## 2017-09-01 NOTE — Progress Notes (Signed)
   [facility] Arthur Chars, MD Phone: [phone]  Reason For Visit: SDA for Abdominal Pain   # Patient presents with congestion, started 2 days ago. Has had cough. No sore throat Indicates having chills but no fever.  Last night threw up twice and felt nauseated  Denies any diarrhea Still feeling nauseated, A little abdominal pain, constant, diffuse abdominal- cannot point to a specific area  Past Medical History Reviewed problem list.  Medications- reviewed and updated No additions to family history Social history- patient is a non- smoker  Objective: BP 112/84 (BP Location: Left Arm, Patient Position: Sitting, Cuff Size: Normal)   Pulse 96   Temp 98.4 F (36.9 C) (Oral)   Ht  (1.727 m)   Wt 222 lb 3.2 oz (100.8 kg)   SpO2 96%   BMI 33.79 kg/m  Gen: NAD, alert, cooperative with exam HEENT: Normal    Neck: No masses palpated. No lymphadenopathy    Nose: Nasal congestion    Throat: moist mucus membranes, no erythema Cardio: regular rate and rhythm, S1S2 heard, no murmurs appreciated Pulm: clear to auscultation bilaterally, no wheezes, rhonchi or rales GI: soft, non-tender, non-distended, bowel sounds present, no hepatomegaly, no splenomegaly Skin: dry, intact, no rashes or lesions   Assessment/Plan: See problem based a/p  Gastroenteritis Likely patient with a virus. Physical exam without any significant abdominal pain on palpation. Unlikely acute abdomen. - ondansetron (ZOFRAN) 4 MG tablet; Take 1 tablet (4 mg total) by mouth every 8 (eight) hours as needed for nausea or vomiting.  Dispense: 15 tablet; Refill: 0 - Loratadine 10 MG CAPS; Once daily for congestion  Dispense: 30 each; Refill: 0 - fluticasone (FLONASE) 50 MCG/ACT nasal spray; Place 2 sprays into both nostrils daily.  Dispense: 16 g; Refill: 2 - Please follow-up as needed - Work note provided

## 2017-09-02 ENCOUNTER — Other Ambulatory Visit (HOSPITAL_COMMUNITY): Payer: BLUE CROSS/BLUE SHIELD

## 2017-09-03 ENCOUNTER — Other Ambulatory Visit (HOSPITAL_COMMUNITY): Payer: BLUE CROSS/BLUE SHIELD

## 2017-09-06 ENCOUNTER — Other Ambulatory Visit (HOSPITAL_COMMUNITY): Payer: BLUE CROSS/BLUE SHIELD

## 2017-09-06 NOTE — Assessment & Plan Note (Addendum)
Likely patient with a virus. Physical exam without any significant abdominal pain on palpation. Unlikely acute abdomen. - ondansetron (ZOFRAN) 4 MG tablet; Take 1 tablet (4 mg total) by mouth every 8 (eight) hours as needed for nausea or vomiting.  Dispense: 15 tablet; Refill: 0 - Loratadine 10 MG CAPS; Once daily for congestion  Dispense: 30 each; Refill: 0 - fluticasone (FLONASE) 50 MCG/ACT nasal spray; Place 2 sprays into both nostrils daily.  Dispense: 16 g; Refill: 2 - Please follow-up as needed - Work note provided

## 2017-09-07 ENCOUNTER — Other Ambulatory Visit (HOSPITAL_COMMUNITY): Payer: BLUE CROSS/BLUE SHIELD

## 2017-09-08 ENCOUNTER — Other Ambulatory Visit (HOSPITAL_COMMUNITY): Payer: BLUE CROSS/BLUE SHIELD

## 2017-09-08 ENCOUNTER — Ambulatory Visit (INDEPENDENT_AMBULATORY_CARE_PROVIDER_SITE_OTHER): Payer: BLUE CROSS/BLUE SHIELD | Admitting: Clinical

## 2017-09-08 DIAGNOSIS — F331 Major depressive disorder, recurrent, moderate: Secondary | ICD-10-CM | POA: Diagnosis not present

## 2017-09-09 ENCOUNTER — Other Ambulatory Visit (HOSPITAL_COMMUNITY): Payer: BLUE CROSS/BLUE SHIELD

## 2017-09-10 ENCOUNTER — Other Ambulatory Visit (HOSPITAL_COMMUNITY): Payer: BLUE CROSS/BLUE SHIELD

## 2017-09-13 ENCOUNTER — Other Ambulatory Visit (HOSPITAL_COMMUNITY): Payer: BLUE CROSS/BLUE SHIELD

## 2017-09-14 ENCOUNTER — Ambulatory Visit (INDEPENDENT_AMBULATORY_CARE_PROVIDER_SITE_OTHER): Payer: BLUE CROSS/BLUE SHIELD | Admitting: Psychiatry

## 2017-09-14 ENCOUNTER — Encounter (HOSPITAL_COMMUNITY): Payer: Self-pay | Admitting: Psychiatry

## 2017-09-14 VITALS — BP 132/78 | HR 98 | Ht 68.0 in | Wt 218.8 lb

## 2017-09-14 DIAGNOSIS — Z818 Family history of other mental and behavioral disorders: Secondary | ICD-10-CM | POA: Diagnosis not present

## 2017-09-14 DIAGNOSIS — F5104 Psychophysiologic insomnia: Secondary | ICD-10-CM | POA: Diagnosis not present

## 2017-09-14 DIAGNOSIS — F3341 Major depressive disorder, recurrent, in partial remission: Secondary | ICD-10-CM | POA: Diagnosis not present

## 2017-09-14 DIAGNOSIS — Z79899 Other long term (current) drug therapy: Secondary | ICD-10-CM | POA: Diagnosis not present

## 2017-09-14 DIAGNOSIS — F4010 Social phobia, unspecified: Secondary | ICD-10-CM

## 2017-09-14 MED ORDER — NORTRIPTYLINE HCL 25 MG PO CAPS: 25.0000 mg | ORAL_CAPSULE | Freq: Every day | ORAL | 2 refills | Status: DC

## 2017-09-14 MED ORDER — PROPRANOLOL HCL 10 MG PO TABS: 10.0000 mg | ORAL_TABLET | Freq: Two times a day (BID) | ORAL | 2 refills | Status: DC | PRN

## 2017-09-14 NOTE — Progress Notes (Signed)
BH MD/PA/NP OP Progress Note  09/14/2017 12:25 PM Arthur Dodson  MRN:  [id number]  Chief Complaint:  med check  HPI: Arthur Dodson reports that things are going better, but still not great. He reports that he feels like his mood is more stable and he is not down in the dumps. He continues on Prozac 60 mg, but has stopped taking trazodone and gabapentin, feeling that his sleep cycle is on a better track. He still has some difficulty with early morning awakenings and insomnia. He continues in individual therapy with Dr. Dewayne Hatch and reports that he feels very comfortable with her. We spent time discussing his return to work, over the past 2 weeks. He does continue to struggle with interpersonal and social anxiety, and has the associated physical symptoms of nervousness, sweating, rapid heart rate. We discussed the use of propranolol as needed for anticipatory social or performance anxiety.  We discussed augmenting Prozac 60 mg with nortriptyline 25 mg nightly, given his plateau of benefit. We considered switching to an alternative agent, the Prozac has been generally well tolerated and has provided him benefit. He denies any acute safety issues or suicidality. We reviewed the risks and benefits, including the possibility of serotonin syndrome.  With regard to work, he does continue to struggle with anxiety, and he and I discussed supporting the use of 5-10 hours of FMLA time per week if he was having a more difficult day and needed to go home early or come in later the next day. He will think about this and send paperwork if he decides to apply for FMLA time.   Visit Diagnosis:    ICD-10-CM   1. Social anxiety disorder F40.10 propranolol (INDERAL) 10 MG tablet  2. Recurrent major depressive disorder, in partial remission (HCC) F33.41 nortriptyline (PAMELOR) 25 MG capsule  3. Psychophysiological insomnia F51.04     Past Psychiatric History: Recently completed the PHP in this office  Past  Medical History:  Past Medical History:  Diagnosis Date  . Anxiety   . Cholesteatoma   . Depression     Past Surgical History:  Procedure Laterality Date  . INCISION AND DRAINAGE ABSCESS     when he was 75 months old  . SCROTAL EXPLORATION     when he was 1 day old    Family Psychiatric History: See intake H&P for full details. Reviewed, with no updates at this time.   Family History:  Family History  Problem Relation Age of Onset  . Depression Mother     Social History:  Social History   Social History  . Marital status: Single    Spouse name: N/A  . Number of children: N/A  . Years of education: N/A   Social History Main Topics  . Smoking status: Never Smoker  . Smokeless tobacco: Never Used  . Alcohol use No  . Drug use: No  . Sexual activity: No   Other Topics Concern  . None   Social History Narrative  . None    Allergies: No Known Allergies  Metabolic Disorder Labs: No results found for: HGBA1C, MPG No results found for: PROLACTIN No results found for: CHOL, TRIG, HDL, CHOLHDL, VLDL, LDLCALC No results found for: TSH  Therapeutic Level Labs: No results found for: LITHIUM No results found for: VALPROATE No components found for:  CBMZ  Current Medications: Current Outpatient Prescriptions  Medication Sig Dispense Refill  . FLUoxetine 60 MG TABS Take 60 mg by mouth daily. 90 tablet 1  .  fluticasone (FLONASE) 50 MCG/ACT nasal spray Place 2 sprays into both nostrils daily. 16 g 2  . Loratadine 10 MG CAPS Once daily for congestion 30 each 0  . nortriptyline (PAMELOR) 25 MG capsule Take 1 capsule (25 mg total) by mouth at bedtime. 30 capsule 2  . propranolol (INDERAL) 10 MG tablet Take 1 tablet (10 mg total) by mouth 2 (two) times daily as needed. For social anxiety and panic 60 tablet 2   No current facility-administered medications for this visit.      Musculoskeletal: Strength & Muscle Tone: within normal limits Gait & Station:  normal Patient leans: N/A  Psychiatric Specialty Exam: Review of Systems  Constitutional: Negative.   HENT: Negative.   Respiratory: Negative.   Cardiovascular: Negative.   Gastrointestinal: Negative.   Musculoskeletal: Negative.   Neurological: Negative.   Psychiatric/Behavioral: Positive for depression. Negative for hallucinations, memory loss, substance abuse and suicidal ideas. The patient is nervous/anxious and has insomnia.     Blood pressure 132/78, pulse 98, height  (1.727 m), weight 218 lb 12.8 oz (99.2 kg).Body mass index is 33.27 kg/m.  Arthur Appearance: Casual and Fairly Groomed  Eye Contact:  Fair  Speech:  Clear and Coherent  Volume:  Decreased  Mood:  Dysphoric  Affect:  Constricted and Flat  Thought Process:  Goal Directed  Orientation:  Full (Time, Place, and Person)  Thought Content: Logical   Suicidal Thoughts:  No  Homicidal Thoughts:  No  Memory:  Immediate;   Good  Judgement:  Good  Insight:  Good  Psychomotor Activity:  Normal  Concentration:  Attention Span: Good  Recall:  Good  Fund of Knowledge: Good  Language: Good  Akathisia:  Negative  Handed:  Right  AIMS (if indicated): not done  Assets:  Communication Skills Desire for Improvement Financial Resources/Insurance Housing Leisure Time Physical Health Social Support Talents/Skills Transportation Vocational/Educational  ADL's:  Intact  Cognition: WNL  Sleep:  Fair   Screenings: PHQ2-9     Office Visit from 09/01/2017 in [facility] Office Visit from 06/25/2017 in [facility] Office Visit from 09/02/2016 in [facility] Care at [facility] Total Score  0  1  4  PHQ-9 Total Score  -  -  14       Assessment and Plan:  Arthur Dodson is a 26 year old male with severe major depressive disorder and social anxiety, who recently completed PHP. He appears to be past the most recent crisis, and reports improved mood stability. He continues to  present with some depressed affect, and I believe he would benefit from augmentation as below. We also discussed some of his work stressors and social anxiety, which she continues to work on in individual therapy. No acute safety issues at this time and will follow up in 8-10 weeks.  1. Social anxiety disorder   2. Recurrent major depressive disorder, in partial remission (HCC)   3. Psychophysiological insomnia    Continue Prozac 60 mg daily Discontinue gabapentin and trazodone given no longer using Initiate nortriptyline 25 mg nightly for sleep and augmentation Continue in individual therapy Propranolol 10 mg twice daily for social anxiety or panic We'll support patient for 10 hours of FMLA time per week  I spent 30 minutes with the patient in direct care and counseling. We discussed return to work, mood and anxiety symptoms, and planning moving forward in terms of medication management and therapy.     Burnard Leigh, MD 09/14/2017,  12:25 PM

## 2017-09-14 NOTE — Patient Instructions (Signed)
Take propranolol 10 mg twice a day as needed for panic or anxiety  Start Nortriptyline in the evenings  Continue Prozac 60 mg daily

## 2017-09-15 ENCOUNTER — Ambulatory Visit (INDEPENDENT_AMBULATORY_CARE_PROVIDER_SITE_OTHER): Payer: BLUE CROSS/BLUE SHIELD | Admitting: Clinical

## 2017-09-15 DIAGNOSIS — F331 Major depressive disorder, recurrent, moderate: Secondary | ICD-10-CM

## 2017-09-22 ENCOUNTER — Ambulatory Visit (INDEPENDENT_AMBULATORY_CARE_PROVIDER_SITE_OTHER): Payer: BLUE CROSS/BLUE SHIELD | Admitting: Clinical

## 2017-09-22 DIAGNOSIS — F331 Major depressive disorder, recurrent, moderate: Secondary | ICD-10-CM

## 2017-09-27 ENCOUNTER — Ambulatory Visit (INDEPENDENT_AMBULATORY_CARE_PROVIDER_SITE_OTHER): Payer: BLUE CROSS/BLUE SHIELD | Admitting: Clinical

## 2017-09-27 ENCOUNTER — Other Ambulatory Visit: Payer: Self-pay | Admitting: Family Medicine

## 2017-09-27 DIAGNOSIS — F331 Major depressive disorder, recurrent, moderate: Secondary | ICD-10-CM | POA: Diagnosis not present

## 2017-09-27 DIAGNOSIS — R0981 Nasal congestion: Secondary | ICD-10-CM

## 2017-09-27 MED ORDER — FLUTICASONE PROPIONATE 50 MCG/ACT NA SUSP: 2.0000 | Freq: Every day | NASAL | 2 refills | Status: DC

## 2017-09-27 NOTE — Progress Notes (Signed)
Received fax request for 90d supply of fluticasone. Sent this as per orders.

## 2017-09-29 ENCOUNTER — Encounter (HOSPITAL_COMMUNITY): Payer: Self-pay | Admitting: Psychiatry

## 2017-10-06 ENCOUNTER — Other Ambulatory Visit (HOSPITAL_COMMUNITY): Payer: Self-pay

## 2017-10-06 ENCOUNTER — Ambulatory Visit (INDEPENDENT_AMBULATORY_CARE_PROVIDER_SITE_OTHER): Payer: BLUE CROSS/BLUE SHIELD | Admitting: Clinical

## 2017-10-06 DIAGNOSIS — F331 Major depressive disorder, recurrent, moderate: Secondary | ICD-10-CM | POA: Diagnosis not present

## 2017-10-06 DIAGNOSIS — F3341 Major depressive disorder, recurrent, in partial remission: Secondary | ICD-10-CM

## 2017-10-06 DIAGNOSIS — F4010 Social phobia, unspecified: Secondary | ICD-10-CM

## 2017-10-06 MED ORDER — NORTRIPTYLINE HCL 25 MG PO CAPS: 25.0000 mg | ORAL_CAPSULE | Freq: Every day | ORAL | 0 refills | Status: DC

## 2017-10-06 MED ORDER — PROPRANOLOL HCL 10 MG PO TABS: 10.0000 mg | ORAL_TABLET | Freq: Two times a day (BID) | ORAL | 0 refills | Status: DC | PRN

## 2017-10-06 NOTE — Telephone Encounter (Signed)
Hey regan, Arthur Dodson's FMLA paperwork should be coming through fax soon so we can support him to get 20 hours per week of as needed mental health rest for anxiety, depression, panic

## 2017-10-06 NOTE — Progress Notes (Unsigned)
Pharmacy sent request for a 90 day supply. Reordered for 90 day.

## 2017-10-13 ENCOUNTER — Ambulatory Visit (INDEPENDENT_AMBULATORY_CARE_PROVIDER_SITE_OTHER): Payer: BLUE CROSS/BLUE SHIELD | Admitting: Clinical

## 2017-10-13 DIAGNOSIS — F331 Major depressive disorder, recurrent, moderate: Secondary | ICD-10-CM | POA: Diagnosis not present

## 2017-10-15 ENCOUNTER — Encounter (HOSPITAL_COMMUNITY): Payer: Self-pay | Admitting: Psychiatry

## 2017-10-15 ENCOUNTER — Telehealth (HOSPITAL_COMMUNITY): Payer: Self-pay | Admitting: Psychiatry

## 2017-10-15 DIAGNOSIS — F3341 Major depressive disorder, recurrent, in partial remission: Secondary | ICD-10-CM

## 2017-10-15 NOTE — Telephone Encounter (Signed)
Nevermind I have a minute, im calling him now.

## 2017-10-15 NOTE — Telephone Encounter (Signed)
Hey Arthur Dodson, can you give him a call and see whats up. I have a packed morning and want to make sure we get up with him

## 2017-10-16 MED ORDER — NORTRIPTYLINE HCL 25 MG PO CAPS: 50.0000 mg | ORAL_CAPSULE | Freq: Every day | ORAL | 3 refills | Status: DC

## 2017-10-18 NOTE — Telephone Encounter (Signed)
Okay, thank you!

## 2017-10-20 ENCOUNTER — Ambulatory Visit: Payer: BLUE CROSS/BLUE SHIELD | Admitting: Clinical

## 2017-10-20 DIAGNOSIS — F331 Major depressive disorder, recurrent, moderate: Secondary | ICD-10-CM

## 2017-10-21 NOTE — Telephone Encounter (Signed)
Arthur Dodson is interested in rejoining IOP or PHP.  Do you mind giving him a call?

## 2017-10-22 ENCOUNTER — Encounter (HOSPITAL_COMMUNITY): Payer: Self-pay | Admitting: Psychiatry

## 2017-10-22 ENCOUNTER — Other Ambulatory Visit (HOSPITAL_COMMUNITY): Payer: BLUE CROSS/BLUE SHIELD | Attending: Psychiatry | Admitting: Psychiatry

## 2017-10-22 DIAGNOSIS — F332 Major depressive disorder, recurrent severe without psychotic features: Secondary | ICD-10-CM | POA: Insufficient documentation

## 2017-10-22 DIAGNOSIS — F4010 Social phobia, unspecified: Secondary | ICD-10-CM | POA: Insufficient documentation

## 2017-10-22 NOTE — Progress Notes (Signed)
Psychiatric Initial Adult Assessment   Patient Identification: Arthur Dodson MRN:  [id number] Date of Evaluation:  10/22/2017 Referral Source: Dr Rene KocherEksir Chief Complaint:depression with suicidal ideation   Visit Diagnosis: severe major depression recurrent without psychosis.  Social anxiety disorder  History of Present Illness:  [name] returns to this program with more severe depression.  He was doing better but had a worsening episode to the point of suicidal thoughts that keep returning.  His stressors continue being his mother living with him and finances as well as the chronic mental illness that just will not go away.  Says he knows he will have to learn to accept the depression and deal with it rather than thinking it will go away with treatment.  No specific triggers.  He is an overthinker to the point of being obsessive about things.  Associated Signs/Symptoms: Depression Symptoms:  depressed mood, anhedonia, insomnia, hypersomnia, fatigue, difficulty concentrating, hopelessness, impaired memory, recurrent thoughts of death, suicidal thoughts without plan, anxiety, (Hypo) Manic Symptoms:  Irritable Mood, Anxiety Symptoms:  Excessive Worry, Panic Symptoms, Psychotic Symptoms:  none PTSD Symptoms: Negative  Past Psychiatric History: ongoing outpatient therapy and med management  Previous Psychotropic Medications: Yes   Substance Abuse History in the last 12 months:  No.  Consequences of Substance Abuse: Negative  Past Medical History:  Past Medical History:  Diagnosis Date  . Anxiety   . Cholesteatoma   . Depression     Past Surgical History:  Procedure Laterality Date  . INCISION AND DRAINAGE ABSCESS     when he was 636 months old  . SCROTAL EXPLORATION     when he was 1 day old    Family Psychiatric History: no changes  Family History:  Family History  Problem Relation Age of Onset  . Depression Mother     Social History:   Social History    Socioeconomic History  . Marital status: Single    Spouse name: Not on file  . Number of children: Not on file  . Years of education: Not on file  . Highest education level: Not on file  Social Needs  . Financial resource strain: Not on file  . Food insecurity - worry: Not on file  . Food insecurity - inability: Not on file  . Transportation needs - medical: Not on file  . Transportation needs - non-medical: Not on file  Occupational History  . Not on file  Tobacco Use  . Smoking status: Never Smoker  . Smokeless tobacco: Never Used  Substance and Sexual Activity  . Alcohol use: No  . Drug use: No  . Sexual activity: No  Other Topics Concern  . Not on file  Social History Narrative  . Not on file    Additional Social History: none  Allergies:  No Known Allergies  Metabolic Disorder Labs: No results found for: HGBA1C, MPG No results found for: PROLACTIN No results found for: CHOL, TRIG, HDL, CHOLHDL, VLDL, LDLCALC   Current Medications: Current Outpatient Medications  Medication Sig Dispense Refill  . FLUoxetine 60 MG TABS Take 60 mg by mouth daily. 90 tablet 1  . fluticasone (FLONASE) 50 MCG/ACT nasal spray Place 2 sprays into both nostrils daily. 48 g 2  . Loratadine 10 MG CAPS Once daily for congestion 30 each 0  . nortriptyline (PAMELOR) 25 MG capsule Take 2 capsules (50 mg total) at bedtime by mouth. 60 capsule 3  . propranolol (INDERAL) 10 MG tablet Take 1 tablet (10 mg total) by  mouth 2 (two) times daily as needed. For social anxiety and panic 180 tablet 0   No current facility-administered medications for this visit.     Neurologic: Headache: Negative Seizure: Negative Paresthesias:Negative  Musculoskeletal: Strength & Muscle Tone: within normal limits Gait & Station: normal Patient leans: N/A  Psychiatric Specialty Exam: ROS  There were no vitals taken for this visit.There is no height or weight on file to calculate BMI.  General Appearance:  Casual  Eye Contact:  Good  Speech:  Clear and Coherent  Volume:  Normal  Mood:  Anxious and Depressed  Affect:  Appropriate  Thought Process:  Coherent and Goal Directed  Orientation:  Full (Time, Place, and Person)  Thought Content:  Logical  Suicidal Thoughts:  Yes.  without intent/plan  Homicidal Thoughts:  No  Memory:  Immediate;   Good Recent;   Good Remote;   Good  Judgement:  Good  Insight:  Good  Psychomotor Activity:  Normal  Concentration:  Concentration: Good and Attention Span: Good  Recall:  Good  Fund of Knowledge:Good  Language: Good  Akathisia:  Negative  Handed:  Right  AIMS (if indicated):  0  Assets:  Communication Skills Desire for Improvement Financial Resources/Insurance Housing Physical Health Social Support Talents/Skills Transportation Vocational/Educational  ADL's:  Intact  Cognition: WNL  Sleep:  poor    Treatment Plan Summary: Admit to IOP with daily group.  No med changes at this point.   Carolanne GrumblingGerald Lusero Nordlund, MD 11/16/201810:42 AM

## 2017-10-22 NOTE — Progress Notes (Signed)
Arthur Dodson is a 26 y.o., employed, single, Caucasian male; who was referred per Dr. Rene KocherEksir; treatment for worsening anxiety and depressive sx's with SI.  Denies a plan or intent.  Discussed safety options at length with pt.  Pt is able to contract for safety.  Pt was recently in MH-IOP (September 2018) due to depression and anxiety.  This is patient's third time being admitted in MH-IOP this year.  No new stressors:  Continued financial strain along with mentally ill mother residing with him. Pt states he is currently seeing his therapist (Dr. Donnald GarreMedellson (sp?) once a week and has a thought journal now.  Pt scored 28 on the depression checklist.  Pt plans to work in the afternoons after MH-IOP.  A:  Re-oriented pt to MH-IOP.  Provided pt with an orientation folder.  Will inform Drs. Eksir and [employer]ellson of admit.  Encouraged support groups.  R:  Pt receptive.          Chestine SporeLARK, RITA, M.Ed, CNA

## 2017-10-25 ENCOUNTER — Other Ambulatory Visit (HOSPITAL_COMMUNITY): Payer: BLUE CROSS/BLUE SHIELD | Admitting: Psychiatry

## 2017-10-25 DIAGNOSIS — F332 Major depressive disorder, recurrent severe without psychotic features: Secondary | ICD-10-CM

## 2017-10-26 ENCOUNTER — Ambulatory Visit: Payer: BLUE CROSS/BLUE SHIELD | Admitting: Clinical

## 2017-10-26 ENCOUNTER — Other Ambulatory Visit (HOSPITAL_COMMUNITY): Payer: BLUE CROSS/BLUE SHIELD

## 2017-10-26 NOTE — Progress Notes (Signed)
    Daily Group Progress Note  Program: IOP  Group Time: 9:00-12:00   Participation Level: Active   Behavioral Response: Appropriate   Type of Therapy:  Group Therapy   Summary of Progress: Pt presented as engaged.  Pt said he is trying to come to terms with his depression and suicidality.  Pt watched Self Compassion Felicity Pellegrinied Talk and participated in follow-up discussion.  Pt said that he battles with himself to accept where he is emotionally, without judgment.  Pt provided affirming feedback to other group members.  Shaune PollackBrown, Jennifer B, LPC

## 2017-10-26 NOTE — Progress Notes (Signed)
    Daily Group Progress Note  Program: IOP  Group Time: 9:00-12:00   Participation Level: Active   Behavioral Response: Appropriate   Type of Therapy:  Group Therapy   Summary of Progress: Pt presented as engaged.  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  Counselors challenged pt to consider the amount of energy that he is devoting to judging himself and what he could be "gaining" from being depressed.  Pt said that being at an emotional low is more familiar to him.  Pt participated in values and purpose activity.  Pt confidently identified communication, humor and perspective-taking as personal strengths.  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  Shaune PollackBrown, Jennifer B, LPC

## 2017-10-27 ENCOUNTER — Other Ambulatory Visit (HOSPITAL_COMMUNITY): Payer: BLUE CROSS/BLUE SHIELD | Admitting: Psychiatry

## 2017-10-27 DIAGNOSIS — F332 Major depressive disorder, recurrent severe without psychotic features: Secondary | ICD-10-CM

## 2017-10-27 NOTE — Progress Notes (Signed)
    Daily Group Progress Note  Program: IOP  Group Time: 9:00-12:00   Participation Level: Active   Behavioral Response: Attention-Seeking; Avoidant   Type of Therapy:  Group Therapy   Summary of Progress: Pt presented as alert.  Pt said that he had a panic attack the previous day in session with his therapist, when he started thinking about going back to work while going through IOP.  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  Counselors suggested setting some small, concrete goals that could be small wins for pt to work toward.  Pt cried and expressed self-deprecating thoughts and fear of change.  With counselors' facilitation, pt named a goal of reaching out to his former professor about job prospects in the area and expressed that he would like the group to hold him accountable to reaching this goal.  Shaune PollackBrown, Jennifer B, [facility] CenterPC

## 2017-10-29 ENCOUNTER — Other Ambulatory Visit (HOSPITAL_COMMUNITY): Payer: BLUE CROSS/BLUE SHIELD

## 2017-11-01 ENCOUNTER — Other Ambulatory Visit (HOSPITAL_COMMUNITY): Payer: BLUE CROSS/BLUE SHIELD | Admitting: Psychiatry

## 2017-11-01 DIAGNOSIS — F332 Major depressive disorder, recurrent severe without psychotic features: Secondary | ICD-10-CM

## 2017-11-02 ENCOUNTER — Other Ambulatory Visit (HOSPITAL_COMMUNITY): Payer: BLUE CROSS/BLUE SHIELD | Admitting: Psychiatry

## 2017-11-02 DIAGNOSIS — F332 Major depressive disorder, recurrent severe without psychotic features: Secondary | ICD-10-CM

## 2017-11-03 ENCOUNTER — Ambulatory Visit: Payer: BLUE CROSS/BLUE SHIELD | Admitting: Clinical

## 2017-11-04 ENCOUNTER — Other Ambulatory Visit (HOSPITAL_COMMUNITY): Payer: BLUE CROSS/BLUE SHIELD | Admitting: Psychiatry

## 2017-11-04 DIAGNOSIS — F332 Major depressive disorder, recurrent severe without psychotic features: Secondary | ICD-10-CM

## 2017-11-04 NOTE — Progress Notes (Incomplete)
    Daily Group Progress Note  Program: IOP  Group Time:   Participation Level: {CHL AMB BH Group Participation:21022742}  Behavioral Response: {CHL AMB BH Group Behavior:21022743}  Type of Therapy:  {CHL AMB BH Type of Therapy:21022741}  Summary of Progress: ***      Shaune PollackBrown, Gale Hulse B, LPC

## 2017-11-04 NOTE — Progress Notes (Deleted)
    Daily Group Progress Note  Program: IOP Group Time: 9:00-12:00  Participation Level: Active  Behavioral Response: Appropriate  Type of Therapy:  Group Therapy  Summary of Progress: Pt. Presented as quiet, alert, engaged in the group process. Pt. Shared that she was feeling very good, that she was sleeping well and that her appetite was good. Pt. Shared that she had plans to have lunch with her mother after group. Pt. Participated in grief and loss session with the chaplain. Pt. Participated in mindfulness meditation and breathing exercise.     Brown, Jennifer B, LPC 

## 2017-11-04 NOTE — Progress Notes (Signed)
    Daily Group Progress Note  Program: IOP  Group Time: 9:00-12:00   Participation Level: Active   Behavioral Response: Appropriate   Type of Therapy:  Group Therapy   Summary of Progress: Pt presented as engaged.  Pt listened and participated in pharmacist's presentation about medications.  Pt participated in [employer] Card Sort, identifying love, authenticity and peace as his top three values.  Pt said he felt challenged to step out of his comfort zone and worked a full day on Black Friday and worked five hours the following day.  Pt said he reached out to his former professors about potential jobs in the area.  Shaune PollackBrown, Jennifer B, LPC

## 2017-11-04 NOTE — Progress Notes (Signed)
    Daily Group Progress Note  Program: IOP  Group Time: 9:00-12:00   Participation Level: Active   Behavioral Response: Appropriate   Type of Therapy:  Group Therapy   Summary of Progress: Pt presented as engaged.  Pt said that he followed up on emails with his professors.  Pt participated in cognitive modeling activity. Pt. Was able to identify thought  "I will lose everything" that leads him to feel fear and sadness and unable to move forward with productive activity. Pt. Was able to recognize that this leads him to greater inactivity and depression.  Shaune PollackBrown, Pranika Finks B, LPC

## 2017-11-05 ENCOUNTER — Other Ambulatory Visit (HOSPITAL_COMMUNITY): Payer: BLUE CROSS/BLUE SHIELD | Admitting: Psychiatry

## 2017-11-05 DIAGNOSIS — F332 Major depressive disorder, recurrent severe without psychotic features: Secondary | ICD-10-CM | POA: Diagnosis not present

## 2017-11-08 ENCOUNTER — Other Ambulatory Visit (HOSPITAL_COMMUNITY): Payer: BLUE CROSS/BLUE SHIELD | Attending: Psychiatry | Admitting: Psychiatry

## 2017-11-08 DIAGNOSIS — F419 Anxiety disorder, unspecified: Secondary | ICD-10-CM | POA: Insufficient documentation

## 2017-11-08 DIAGNOSIS — F332 Major depressive disorder, recurrent severe without psychotic features: Secondary | ICD-10-CM | POA: Diagnosis not present

## 2017-11-09 ENCOUNTER — Ambulatory Visit (INDEPENDENT_AMBULATORY_CARE_PROVIDER_SITE_OTHER): Payer: BLUE CROSS/BLUE SHIELD | Admitting: Psychiatry

## 2017-11-09 ENCOUNTER — Encounter (HOSPITAL_COMMUNITY): Payer: Self-pay | Admitting: Psychiatry

## 2017-11-09 DIAGNOSIS — F332 Major depressive disorder, recurrent severe without psychotic features: Secondary | ICD-10-CM

## 2017-11-09 DIAGNOSIS — Z818 Family history of other mental and behavioral disorders: Secondary | ICD-10-CM

## 2017-11-09 DIAGNOSIS — Z79899 Other long term (current) drug therapy: Secondary | ICD-10-CM

## 2017-11-09 DIAGNOSIS — F5104 Psychophysiologic insomnia: Secondary | ICD-10-CM

## 2017-11-09 DIAGNOSIS — F3341 Major depressive disorder, recurrent, in partial remission: Secondary | ICD-10-CM

## 2017-11-09 DIAGNOSIS — F4010 Social phobia, unspecified: Secondary | ICD-10-CM | POA: Diagnosis not present

## 2017-11-09 MED ORDER — FLUOXETINE HCL 40 MG PO CAPS: 80.0000 mg | ORAL_CAPSULE | Freq: Every day | ORAL | 1 refills | Status: DC

## 2017-11-09 MED ORDER — ARIPIPRAZOLE 2 MG PO TABS: 2.0000 mg | ORAL_TABLET | Freq: Every day | ORAL | 2 refills | Status: DC

## 2017-11-09 NOTE — Progress Notes (Signed)
    Daily Group Progress Note  Program: IOP  Group Time: 9:00-12:00   Participation Level: Active   Behavioral Response: Appropriate   Type of Therapy:  Group Therapy   Summary of Progress: Pt presented as engaged.  Pt said he was having anxiety around work and thoughts of failure.  Counselors and other group members encouraged pt to consider what it would look like for him to search for a new job.  Counselors provided information about yoga and mindfulness practices and self-care strategies.  Pt chose doing something brave and doing something that he is bad at as his self-care goals for the weekend.  Shaune PollackBrown, Michille Mcelrath B, LPC

## 2017-11-09 NOTE — Progress Notes (Signed)
BH MD/PA/NP OP Progress Note  11/09/2017 10:39 AM Arthur Dodson  MRN:  [id number]  Chief Complaint:  med management  HPI: Arthur Dodson continues to participate in IOP in this office.   He continues to feel very stuck in terms of his mood, reports passive suicidal thoughts, but denies any plan to harm himself.  He continues to have significant rumination, feeling bad about himself, having trouble working.  He agrees that his work environment does not agree with his mental health, and he is looking for alternative jobs.  He continues in the IOP until this Friday and will discharge.  He has not had any substantial improvements with Pamelor nightly augmenting the Prozac.  He does not use propranolol because he did not feel it helped.  We agreed to increase Prozac to 80 mg daily, and augment with Abilify 2 mg.  I reviewed the risks and benefits of atypical antipsychotic, the risks of various EPS akathisia, and TD.  We reviewed that he has been on Celexa, Cymbalta, Prozac, nortriptyline, and we may consider TMS in the future.  I reviewed the risks and benefits of TMS with him.  Spent time discussing the advantage of behavioral activation and focusing on doing tasks day-to-day, in order to activate oneself in 1 sprain.  He was agreeable to aligning on the goal.  He agreed to increase Prozac to 80 mg and augment with Abilify.  Will RTC 10-12 weeks  Visit Diagnosis:    ICD-10-CM   1. Severe recurrent major depression without psychotic features (HCC) F33.2 FLUoxetine (PROZAC) 40 MG capsule    ARIPiprazole (ABILIFY) 2 MG tablet  2. Social anxiety disorder F40.10 FLUoxetine (PROZAC) 40 MG capsule    ARIPiprazole (ABILIFY) 2 MG tablet  3. Recurrent major depressive disorder, in partial remission (HCC) F33.41 FLUoxetine (PROZAC) 40 MG capsule    ARIPiprazole (ABILIFY) 2 MG tablet  4. Psychophysiological insomnia F51.04     Past Psychiatric History: See intake H&P for full details. Reviewed, with no  updates at this time.   Past Medical History:  Past Medical History:  Diagnosis Date  . Anxiety   . Cholesteatoma   . Depression     Past Surgical History:  Procedure Laterality Date  . INCISION AND DRAINAGE ABSCESS     when he was 466 months old  . SCROTAL EXPLORATION     when he was 1 day old    Family Psychiatric History: See intake H&P for full details. Reviewed, with no updates at this time.   Family History:  Family History  Problem Relation Age of Onset  . Depression Mother     Social History:  Social History   Socioeconomic History  . Marital status: Single    Spouse name: Not on file  . Number of children: Not on file  . Years of education: Not on file  . Highest education level: Not on file  Social Needs  . Financial resource strain: Somewhat hard  . Food insecurity - worry: Never true  . Food insecurity - inability: Never true  . Transportation needs - medical: No  . Transportation needs - non-medical: No  Occupational History  . Not on file  Tobacco Use  . Smoking status: Never Smoker  . Smokeless tobacco: Never Used  Substance and Sexual Activity  . Alcohol use: No  . Drug use: No  . Sexual activity: No  Other Topics Concern  . Not on file  Social History Narrative  . Not on file  Allergies: No Known Allergies  Metabolic Disorder Labs: No results found for: HGBA1C, MPG No results found for: PROLACTIN No results found for: CHOL, TRIG, HDL, CHOLHDL, VLDL, LDLCALC No results found for: TSH  Therapeutic Level Labs: No results found for: LITHIUM No results found for: VALPROATE No components found for:  CBMZ  Current Medications: Current Outpatient Medications  Medication Sig Dispense Refill  . ARIPiprazole (ABILIFY) 2 MG tablet Take 1 tablet (2 mg total) by mouth daily. 30 tablet 2  . FLUoxetine (PROZAC) 40 MG capsule Take 2 capsules (80 mg total) by mouth daily. 180 capsule 1  . fluticasone (FLONASE) 50 MCG/ACT nasal spray Place 2  sprays into both nostrils daily. 48 g 2  . Loratadine 10 MG CAPS Once daily for congestion 30 each 0   No current facility-administered medications for this visit.      Musculoskeletal: Strength & Muscle Tone: within normal limits Gait & Station: normal Patient leans: N/A  Psychiatric Specialty Exam: ROS  There were no vitals taken for this visit.There is no height or weight on file to calculate BMI.  General Appearance: Casual and Fairly Groomed  Eye Contact:  Fair  Speech:  Clear and Coherent  Volume:  Normal  Mood:  Anxious and Dysphoric  Affect:  Congruent  Thought Process:  Goal Directed and Descriptions of Associations: Intact  Orientation:  Full (Time, Place, and Person)  Thought Content: Logical   Suicidal Thoughts:  Yes.  without intent/plan  Homicidal Thoughts:  No  Memory:  Immediate;   Fair  Judgement:  Fair  Insight:  Fair  Psychomotor Activity:  Normal  Concentration:  Attention Span: Fair  Recall:  Fair  Fund of Knowledge: Good  Language: Good  Akathisia:  Negative  Handed:  Right  AIMS (if indicated): not done  Assets:  Communication Skills Desire for Improvement Financial Resources/Insurance  ADL's:  Intact  Cognition: WNL  Sleep:  Good   Screenings: PHQ2-9     [facility]  PHQ-2 Total Score  0  1  4  PHQ-9 Total Score  No data  No data  14       Assessment and Plan:  Arthur Dodson continues with depressive symptoms, with ongoing perseveration and rumination.  He has failed to respond to 4 antidepressants, including Prozac, Celexa, Cymbalta, nortriptyline.  He was agreeable to up titrating Prozac to 80 mg max dose, and augmenting with Abilify.  If he has any side effects with the Abilify, he will message Clinical research associatewriter or call clinic.  We reviewed the risks of EPS and TD, including  metabolic side effects.  I also discussed lithium augmentation with him, and if he ultimately feels that Abilify does not agree with him physically and/or mentally, we will start lithium 300 mg nightly.  1. Severe recurrent major depression without psychotic features (HCC)   2. Social anxiety disorder   3. Recurrent major depressive disorder, in partial remission (HCC)   4. Psychophysiological insomnia     Status of current problems: unchanged  Labs Ordered: No orders of the defined types were placed in this encounter.   Labs Reviewed: N/A  Collateral Obtained/Records Reviewed: Reviewed IOP  Plan:  Prozac 80 mg Abilify 2 mg Discontinue propranolol given lack of benefit and lack of use Discontinue nortriptyline given lack of benefit  I spent 25 minutes with the  patient in direct face-to-face clinical care.  Greater than 50% of this time was spent in counseling and coordination of care with the patient.    Burnard Leigh, MD 11/09/2017, 10:39 AM

## 2017-11-09 NOTE — Patient Instructions (Signed)
STOP pamelor STOP propranolol   Prozac 80 mg ( two 40 mg capsules) Start abilify 2 mg in the morning

## 2017-11-10 ENCOUNTER — Other Ambulatory Visit (HOSPITAL_COMMUNITY): Payer: BLUE CROSS/BLUE SHIELD | Admitting: Psychiatry

## 2017-11-10 DIAGNOSIS — F419 Anxiety disorder, unspecified: Secondary | ICD-10-CM | POA: Diagnosis not present

## 2017-11-10 DIAGNOSIS — F332 Major depressive disorder, recurrent severe without psychotic features: Secondary | ICD-10-CM

## 2017-11-10 NOTE — Progress Notes (Signed)
    Daily Group Progress Note  Program: IOP  Group Time: 9:00-12:00   Participation Level: Active   Behavioral Response: Appropriate   Type of Therapy:  Group Therapy   Summary of Progress: Pt presented as engaged.  Pt listened to and engaged in discussion with pharmacist about medications.  Pt shared about experience of practicing mindfulness over the weekend.  Pt said that he sometimes values being correct over being genuine, which he would like to change.  Pt described his unhappiness in his workplace.  Counselor gently challenged pt to consider if he is choosing to be stuck.  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  Shaune PollackBrown, Jennifer B, [facility]

## 2017-11-11 ENCOUNTER — Other Ambulatory Visit (HOSPITAL_COMMUNITY): Payer: BLUE CROSS/BLUE SHIELD | Admitting: Psychiatry

## 2017-11-11 DIAGNOSIS — F419 Anxiety disorder, unspecified: Secondary | ICD-10-CM | POA: Diagnosis not present

## 2017-11-11 DIAGNOSIS — F332 Major depressive disorder, recurrent severe without psychotic features: Secondary | ICD-10-CM

## 2017-11-11 NOTE — Progress Notes (Signed)
    Daily Group Progress Note  Program: IOP  Group Time: 9:00-12:00  Participation Level: Active  Behavioral Response: Appropriate  Type of Therapy:  Group Therapy  Summary of Progress:  Pt. Presents as anxious, talkative, engaged in the group process. Pt. Processed his anxiety and fears about his return to work. Pt. 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 Pt. Received encouragement and support about facing his fears. Pt. Participated in breath focused 4-3-8 meditation exercise. Pt. Participated in yoga therapy with Forde RadonLeanne Yates, LPC.    Shaune PollackBrown, Jennifer B, LPC

## 2017-11-11 NOTE — Progress Notes (Signed)
BH IOP DISCHARGE NOTE  Patient:  Arthur Dodson DOB:  1991-11-14  Date of Admission: 10/22/2017  Date of Discharge: 11/12/2017  Reason for Admission:depression and anxiety  IOP Course:attended and participated.  No great changes in depression or anxiety.  Still anxious and believes he cannot return to work successfully  Mental Status at Discharge:as above  Diagnosis:severe major depression recurrent without psychosis   Level of Care:  IOP  Discharge destination:  Has appointments with his psychiatrist and therapist  Follow-up recommendations:  Recommended to return to work and give it a chance before giving up on it  Comments:  Always seems he should do better as he is insightful and intelligent so whatever IOP offers him is not enough  The patient received suicide prevention pamphlet:  Yes   Carolanne GrumblingGerald Henna Derderian, MD Patient ID: Arthur LenisAnthony Vanasten, male   DOB: 1991-11-14, 26 y.o.   MRN: [id number]

## 2017-11-12 ENCOUNTER — Other Ambulatory Visit (HOSPITAL_COMMUNITY): Payer: BLUE CROSS/BLUE SHIELD | Admitting: Psychiatry

## 2017-11-12 DIAGNOSIS — F332 Major depressive disorder, recurrent severe without psychotic features: Secondary | ICD-10-CM

## 2017-11-12 DIAGNOSIS — F419 Anxiety disorder, unspecified: Secondary | ICD-10-CM | POA: Diagnosis not present

## 2017-11-12 NOTE — Progress Notes (Signed)
Arthur Dodson is a 26 y.o. , employed, single, Caucasian male; who was referred per Dr. Rene KocherEksir; treatment for worsening anxiety and depressive sx's with SI.  Denies a plan or intent.  Discussed safety options at length with pt.  Pt currently denies any further SI.  Pt was recently in MH-IOP (September 2018) due to depression and anxiety.  This is patient's third time being admitted in MH-IOP this year.  No new stressors:  Continued financial strain along with mentally ill mother residing with him. Pt states he is currently seeing his therapist (Dr. Dewayne HatchMendelson) once a week and has a thought journal now.  Pt scored 28 on the depression checklist.  Pt had planned to work in the afternoons after MH-IOP.  Patient unfortunately only worked a few times while in IOP; pt requested at least two letters to excuse him from work during his stay in IOP.  Pt approached Clinical research associatewriter yesterday requesting another excuse.  Writer discussed with Drs. Ladona Ridgelaylor and Eksir and the decision was made not to provide pt with another letter.  Pt informed Clinical research associatewriter that he chose not to go to work lastnight anyway.   A:  D/C pt today.  F/U with Drs. Eksir and Emergency planning/management officerMendelson.  Encouraged support groups.  Recommended pt to call [facility].  R:  Pt receptive.                 Chestine SporeLARK, RITA, M.Ed, CNA

## 2017-11-12 NOTE — Patient Instructions (Addendum)
D:  Pt completed MH-IOP today.  A:  Discharge today.  Will follow up with Dr. Rene KocherEksir 01/26/17 @ 8:30 a.m and Dr. Dewayne HatchMendelson 11/16/17 @ 10 a.m.     .  Encouraged support groups.  R:  Pt receptive.

## 2017-11-12 NOTE — Progress Notes (Signed)
    Daily Group Progress Note  Program: IOP  Group Time: 9:00-12:00  Participation Level: Active  Behavioral Response: Appropriate  Type of Therapy:  Group Therapy  Summary of Progress: Pt. Presented as talkative, engaged in the group process. Pt. Discussed readiness for discharge. Pt. Discussed that he did not go to work, greater acceptance of discontent with work, ability to discuss freely in group and discussed openly with his mother for the first time without guilt. Pt. 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 Pt. Participated in discussion about the importance of giving self permission to have feelings of anger and sadness that might have been silenced by culture or childhood. Pt. Participated in discussion about money management facilitated by speaker from the consumer credit counseling agency.      Shaune PollackBrown, Jennifer B, LPC

## 2017-11-12 NOTE — Progress Notes (Signed)
    Daily Group Progress Note  Program: IOP  Group Time: 9:00-12:00   Participation Level: Active   Behavioral Response: Appropriate   Type of Therapy:  Group Therapy   Summary of Progress: Pt presented as engaged.  Pt provided feedback to other group members, bringing awareness to what they can control and exploring what it would look like to change course.  Counselor challenged pt to think about how his feedback to others could apply to himself.  Pt listened to and engaged in guest presentation about essentials oils and mental health.  Shaune PollackBrown, Jennifer B, LPC

## 2017-11-16 ENCOUNTER — Ambulatory Visit: Payer: BLUE CROSS/BLUE SHIELD | Admitting: Clinical

## 2017-11-17 ENCOUNTER — Ambulatory Visit: Payer: BLUE CROSS/BLUE SHIELD | Admitting: Clinical

## 2017-11-18 ENCOUNTER — Encounter (HOSPITAL_COMMUNITY): Payer: Self-pay | Admitting: Psychiatry

## 2017-11-23 ENCOUNTER — Ambulatory Visit (INDEPENDENT_AMBULATORY_CARE_PROVIDER_SITE_OTHER): Payer: BLUE CROSS/BLUE SHIELD | Admitting: Clinical

## 2017-11-23 DIAGNOSIS — F331 Major depressive disorder, recurrent, moderate: Secondary | ICD-10-CM

## 2017-12-02 ENCOUNTER — Encounter (HOSPITAL_COMMUNITY): Payer: Self-pay | Admitting: Psychiatry

## 2017-12-02 DIAGNOSIS — F4010 Social phobia, unspecified: Secondary | ICD-10-CM

## 2017-12-02 DIAGNOSIS — F332 Major depressive disorder, recurrent severe without psychotic features: Secondary | ICD-10-CM

## 2017-12-02 DIAGNOSIS — F3341 Major depressive disorder, recurrent, in partial remission: Secondary | ICD-10-CM

## 2017-12-08 ENCOUNTER — Ambulatory Visit (INDEPENDENT_AMBULATORY_CARE_PROVIDER_SITE_OTHER): Payer: BLUE CROSS/BLUE SHIELD | Admitting: Clinical

## 2017-12-08 DIAGNOSIS — F322 Major depressive disorder, single episode, severe without psychotic features: Secondary | ICD-10-CM

## 2017-12-15 ENCOUNTER — Ambulatory Visit (INDEPENDENT_AMBULATORY_CARE_PROVIDER_SITE_OTHER): Payer: BLUE CROSS/BLUE SHIELD | Admitting: Clinical

## 2017-12-15 DIAGNOSIS — F331 Major depressive disorder, recurrent, moderate: Secondary | ICD-10-CM | POA: Diagnosis not present

## 2017-12-21 ENCOUNTER — Ambulatory Visit (INDEPENDENT_AMBULATORY_CARE_PROVIDER_SITE_OTHER): Payer: BLUE CROSS/BLUE SHIELD | Admitting: Clinical

## 2017-12-21 DIAGNOSIS — F331 Major depressive disorder, recurrent, moderate: Secondary | ICD-10-CM

## 2017-12-24 ENCOUNTER — Encounter: Payer: Self-pay | Admitting: Family Medicine

## 2017-12-24 ENCOUNTER — Ambulatory Visit: Payer: BLUE CROSS/BLUE SHIELD | Admitting: Family Medicine

## 2017-12-24 ENCOUNTER — Other Ambulatory Visit: Payer: Self-pay

## 2017-12-24 VITALS — BP 108/82 | HR 98 | Temp 98.5°F | Ht 68.0 in | Wt 217.4 lb

## 2017-12-24 DIAGNOSIS — G479 Sleep disorder, unspecified: Secondary | ICD-10-CM | POA: Diagnosis not present

## 2017-12-24 MED ORDER — TRAZODONE HCL 50 MG PO TABS: 25.0000 mg | ORAL_TABLET | Freq: Every evening | ORAL | 3 refills | Status: DC | PRN

## 2017-12-24 NOTE — Patient Instructions (Signed)
Arthur Dodson, you were seen today for difficulty sleeping.  We discussed healthy sleep hygiene, including making a goal time to go to sleep following this and avoiding any screen time at least 1 hour before.  Additionally, you should be keeping a sleep journal detailing time to go to bed, number of times you wake up per night and how many hours of sleep or getting.  I suspect that your high dose of Prozac may be contributing to your insomnia.  I would recommend you talk to psychiatrist about decreasing this dose or transitioning you to a different medication if appropriate.  I have prescribed you trazodone you can take as needed for sleep.   Also recommending that you start exercising daily which can help with sleep.  Please come back in 3-4 weeks.  [name] L. Myrtie SomanWarden, MD [facility] Resident PGY-2 12/24/2017 11:22 AM

## 2017-12-24 NOTE — Progress Notes (Signed)
    Subjective:  Arthur Dodson is a 27 y.o. male [facility] with a chief complaint of insomnia anxiety and depression  HPI:  INSOMNIA:  Started Abilify while in outpatient therapy November and stopped 12/23. Developed trouble sleeping.   Has had insomnia for 73mo Trouble getting to sleep: variable, but mostly staying asleep Trouble waking up too early: yes Naps during the day: naps 2-3 hours 12-3 ocassional Usual times to bed and awake: Midnight to 1AM (off work at 8) Medications tried: trazodone New Medications: prozac 80mg  daily    Symptoms Sweating at night: none Weight loss: none Shortness of breath or hemoptysis: none Muscle pain or weakness: none Severe snoring or daytime sleepiness: yes, new onset daytime sleepiness about mid-afternoon Feeling down or depressed: has been seeing therapist weekly Leg or joint swelling: none  Anxiety and depression Currently on 80 mg of fluoxetine daily.  Recently increased fluoxetine dose to 80 mg back in December and began Abilify 2 mg daily for augmentation.  Around that time patient was having difficulty sleeping and attributed that to Abilify and this was stopped with the approval of his psychiatrist.  Follows up with psychiatrist periodically and sees therapist once weekly.  Reports that mood is stable.  Denies any thoughts of self-harm or harming others.   ROS see HPI Smoking Status noted    PMH: Anxiety and depression Tobacco use: none No drugs or alcohol Medication: reviewed and updated ROS: see HPI   Objective:  Physical Exam: BP 108/82   Pulse 98   Temp 98.5 F (36.9 C) (Oral)   Ht 5\' 8"  (1.727 m)   Wt 217 lb 6.4 oz (98.6 kg)   SpO2 97%   BMI 33.06 kg/m   Gen: 27 year old male in NAD, resting comfortably CV: RRR with no murmurs appreciated Pulm: NWOB, CTAB with no crackles, wheezes, or rhonchi GI: Normal bowel sounds present. Soft, Nontender, Nondistended. MSK: no edema, cyanosis, or  clubbing noted Skin: warm, dry Neuro: grossly normal, moves all extremities Psych: Normal affect and thought content  No results found for this or any previous visit (from the past 72 hour(s)).   Assessment/Plan:  Insomnia Patient reports having difficulty sleeping over the past month and a half at around the time of him beginning his Abilify which she has since discontinued as well as increasing Prozac to 80 mg daily.  We discussed proper sleep hygiene which he reports that he has been trying to do this whole time.  I believe that his insomnia is stemming from being on high dose of Prozac.  Recommended that he keep a sleep journal, try trazodone 25-50 mg as needed for difficulty sleeping and discussed with the psychiatrist whether another medication may be appropriate for his symptoms or decreasing his Prozac.  He is following up with his psychiatrist in the next month.  Patient is not having thoughts of self-harm or harming others at this time I believe he is safe for discharge.  Discussed return precautions.  Follow-up in 3-4 weeks.   Healthcare maintenance patient is due for flu shot today and declined. We will continue to encourage at future visits  Daniel L. Myrtie SomanWarden, MD [facility] Resident PGY-2 12/29/2017 10:15 AM

## 2017-12-28 ENCOUNTER — Ambulatory Visit (INDEPENDENT_AMBULATORY_CARE_PROVIDER_SITE_OTHER): Payer: BLUE CROSS/BLUE SHIELD | Admitting: Clinical

## 2017-12-28 DIAGNOSIS — F331 Major depressive disorder, recurrent, moderate: Secondary | ICD-10-CM | POA: Diagnosis not present

## 2017-12-29 ENCOUNTER — Encounter: Payer: Self-pay | Admitting: Family Medicine

## 2018-01-04 ENCOUNTER — Ambulatory Visit (INDEPENDENT_AMBULATORY_CARE_PROVIDER_SITE_OTHER): Payer: BLUE CROSS/BLUE SHIELD | Admitting: Clinical

## 2018-01-04 DIAGNOSIS — F331 Major depressive disorder, recurrent, moderate: Secondary | ICD-10-CM | POA: Diagnosis not present

## 2018-01-06 DIAGNOSIS — J069 Acute upper respiratory infection, unspecified: Secondary | ICD-10-CM | POA: Diagnosis not present

## 2018-01-11 ENCOUNTER — Ambulatory Visit (INDEPENDENT_AMBULATORY_CARE_PROVIDER_SITE_OTHER): Payer: BLUE CROSS/BLUE SHIELD | Admitting: Clinical

## 2018-01-11 DIAGNOSIS — F331 Major depressive disorder, recurrent, moderate: Secondary | ICD-10-CM

## 2018-01-14 DIAGNOSIS — H9203 Otalgia, bilateral: Secondary | ICD-10-CM | POA: Diagnosis not present

## 2018-01-14 DIAGNOSIS — R6889 Other general symptoms and signs: Secondary | ICD-10-CM | POA: Diagnosis not present

## 2018-01-14 DIAGNOSIS — H6983 Other specified disorders of Eustachian tube, bilateral: Secondary | ICD-10-CM | POA: Diagnosis not present

## 2018-01-17 ENCOUNTER — Other Ambulatory Visit: Payer: Self-pay | Admitting: *Deleted

## 2018-01-18 ENCOUNTER — Ambulatory Visit (INDEPENDENT_AMBULATORY_CARE_PROVIDER_SITE_OTHER): Payer: BLUE CROSS/BLUE SHIELD | Admitting: Clinical

## 2018-01-18 DIAGNOSIS — F331 Major depressive disorder, recurrent, moderate: Secondary | ICD-10-CM | POA: Diagnosis not present

## 2018-01-18 NOTE — Telephone Encounter (Signed)
Contacted pt and gave him the below message.  He said he wasn't even aware that they were sending that request and stated that he did not need that refill cause he still has some on the original Rx.  I told him that they may have done that to save some money with a 90 day supply but that the doctor would want him to come in to see how the medication is doing for him before filling in that quantity.  Routing to PCP as an Financial plannerYI.  Lamonte SakaiZimmerman Rumple, April D, [place]

## 2018-01-18 NOTE — Telephone Encounter (Signed)
I didn't write this rx. I would like him to be seen to see if the trazodone is helping before writing such a large quantity. Please help him get an appt.

## 2018-01-21 IMAGING — DX DG CHEST 2V
2 series · 2 of 2 positions shown · non-contrast
Comparison: 06/18/2017

CLINICAL DATA: Pt here from [REDACTED] for possible aspiration
after surgery

EXAM:
CHEST  2 VIEW

[chest pa]
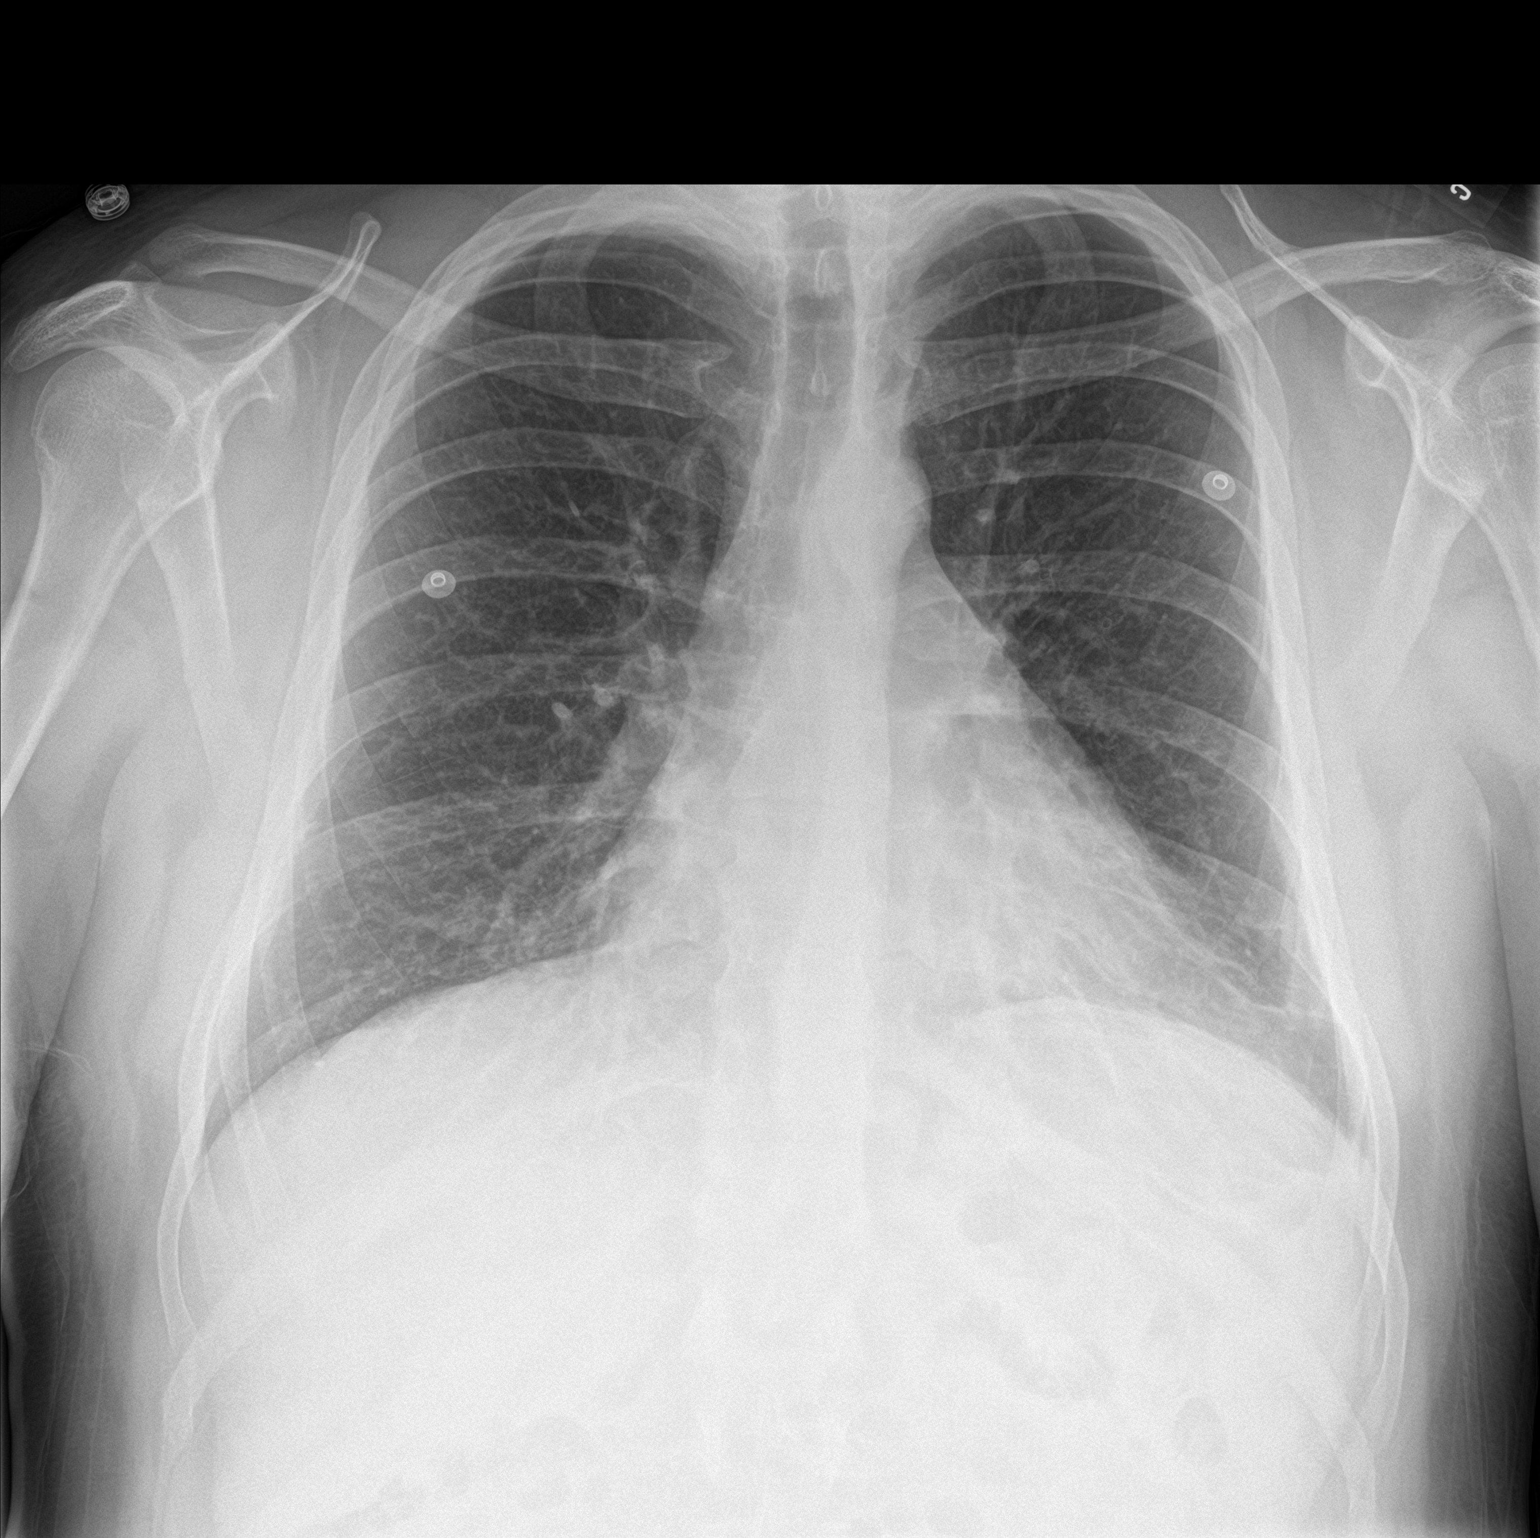

[chest lat]
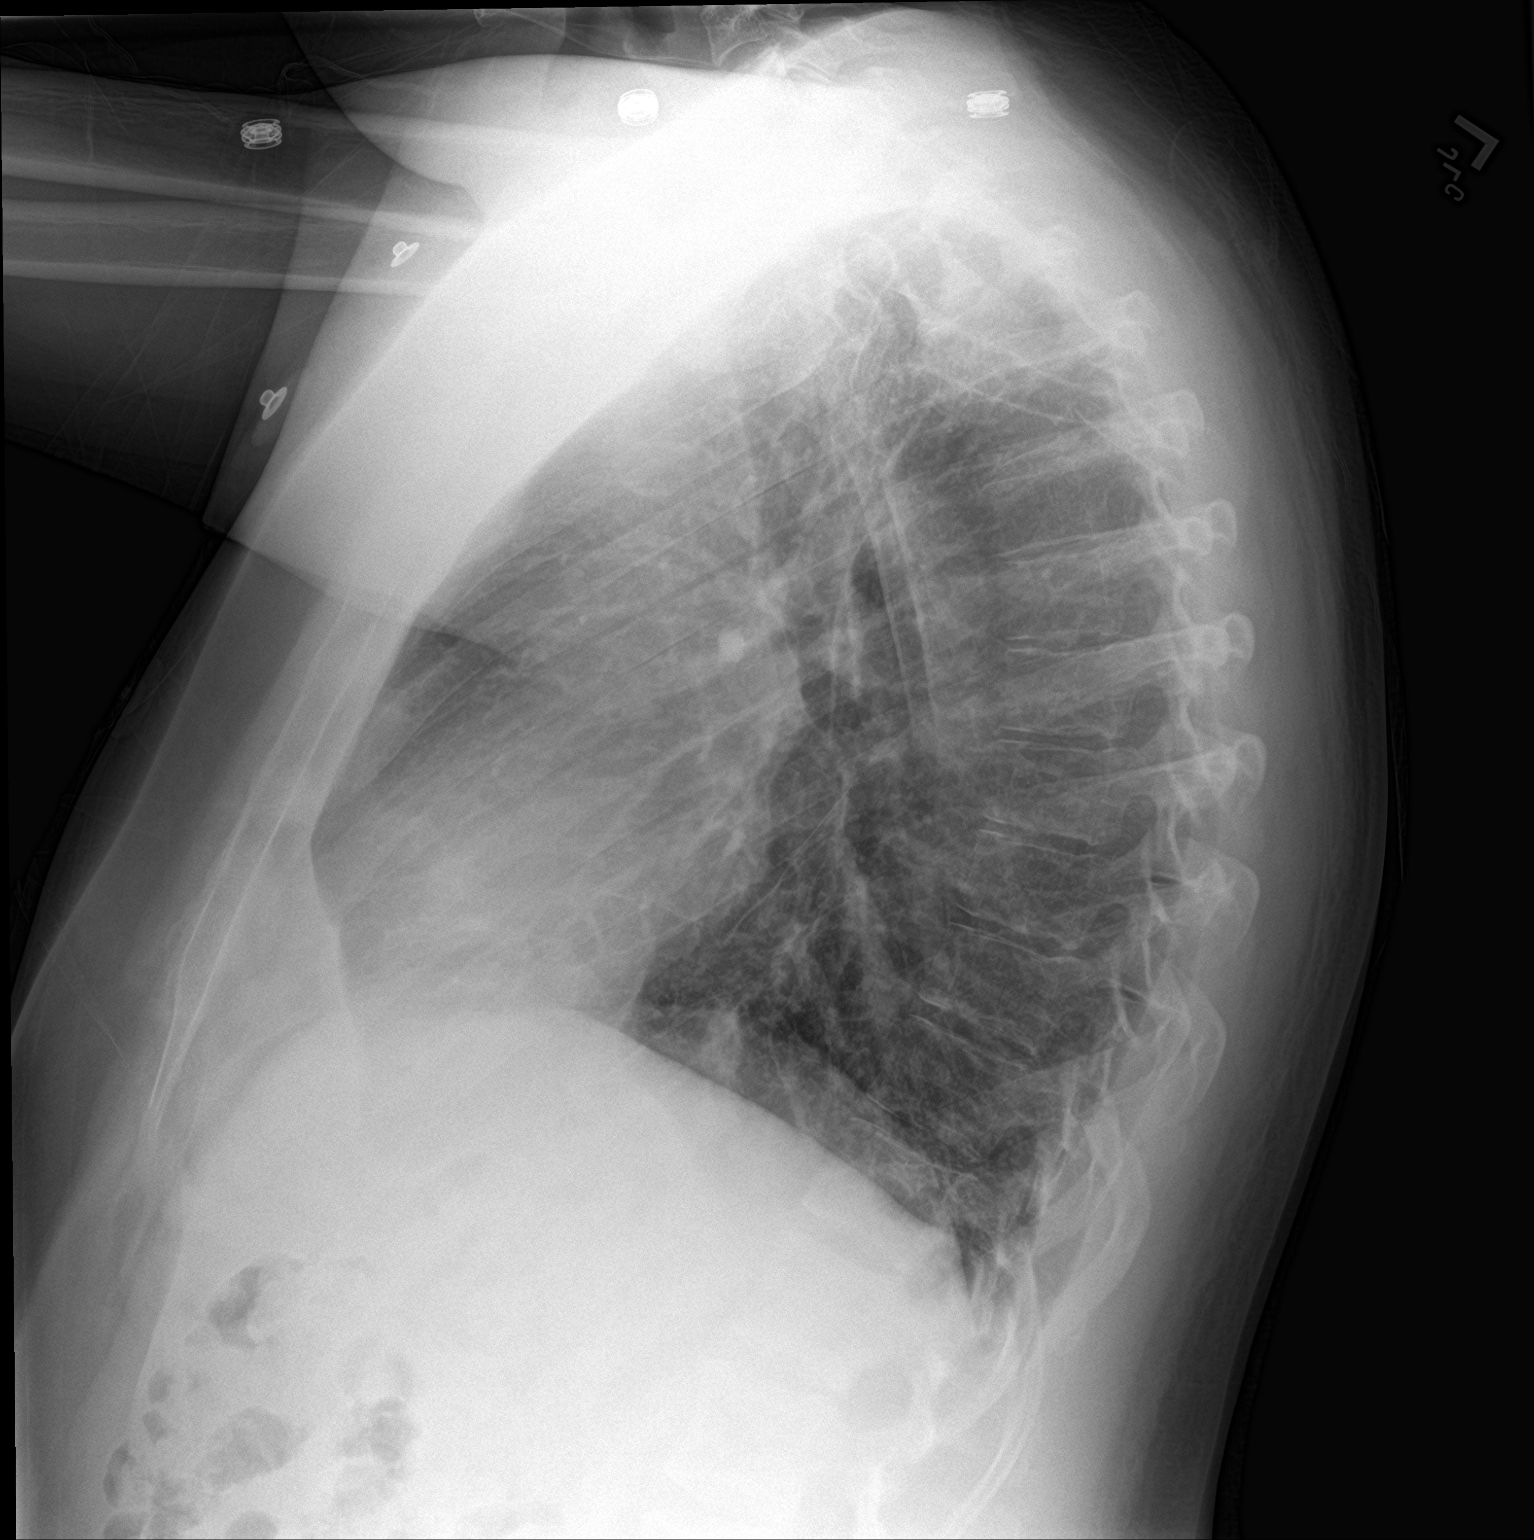

[2 of 2 positions shown; findings below may reference images not displayed]

FINDINGS: Heart, mediastinum and hila are unremarkable.

Mild linear opacity at the left lung base is stable consistent
scarring or atelectasis. Lungs are otherwise clear. No pleural
effusion or pneumothorax.

Skeletal structures are unremarkable.
IMPRESSION: 1. No acute cardiopulmonary disease.  No evidence of aspiration.

## 2018-01-25 ENCOUNTER — Ambulatory Visit: Payer: BLUE CROSS/BLUE SHIELD | Admitting: Clinical

## 2018-01-25 DIAGNOSIS — F331 Major depressive disorder, recurrent, moderate: Secondary | ICD-10-CM | POA: Diagnosis not present

## 2018-01-26 ENCOUNTER — Encounter (HOSPITAL_COMMUNITY): Payer: Self-pay | Admitting: Psychiatry

## 2018-01-26 ENCOUNTER — Ambulatory Visit (HOSPITAL_COMMUNITY): Payer: BLUE CROSS/BLUE SHIELD | Admitting: Psychiatry

## 2018-01-26 DIAGNOSIS — Z79899 Other long term (current) drug therapy: Secondary | ICD-10-CM

## 2018-01-26 DIAGNOSIS — R4581 Low self-esteem: Secondary | ICD-10-CM | POA: Diagnosis not present

## 2018-01-26 DIAGNOSIS — F4010 Social phobia, unspecified: Secondary | ICD-10-CM | POA: Diagnosis not present

## 2018-01-26 DIAGNOSIS — F3341 Major depressive disorder, recurrent, in partial remission: Secondary | ICD-10-CM

## 2018-01-26 DIAGNOSIS — F332 Major depressive disorder, recurrent severe without psychotic features: Secondary | ICD-10-CM | POA: Diagnosis not present

## 2018-01-26 DIAGNOSIS — Z818 Family history of other mental and behavioral disorders: Secondary | ICD-10-CM

## 2018-01-26 MED ORDER — FLUOXETINE HCL 40 MG PO CAPS: 80.0000 mg | ORAL_CAPSULE | Freq: Every day | ORAL | 1 refills | Status: DC

## 2018-01-26 MED ORDER — BREXPIPRAZOLE 1 MG PO TABS: 1.0000 mg | ORAL_TABLET | ORAL | 1 refills | Status: DC

## 2018-01-26 NOTE — Progress Notes (Signed)
[place] MD/PA/NP OP Progress Note  01/26/2018 10:59 AM Arthur Dodson  MRN:  [id number]  Chief Complaint: I was doing okay until last week HPI: Arthur Dodson presents today with dysphoric affect, reporting that Valentine's Day was a hard trigger for him, and he has been depressed for the past week.  He reports that he has been wanting to stay in bed and has felt that his self-esteem has been worse.  I spent time with him having a conversation about behavioral activation, and about committing to improving his own self-care.  I spent time with him discussing acceptance of difficult feelings with the goal in mind of continuing to engage in activating behaviors regardless of the emotional discomfort felt.  I spent time with patient expressing my concern about this cycle that he has been stuck in, and a desire to encourage improved self efficacy and consistent behavioral activation strategies.   He was receptive to recommendations to increase exercise, increased goal-directed activities, and to begin creating a action plan of things that he can actively do when he is feeling down or depressed, which can be productive and life affirming activities for him.  When he reports that he had suicidal thoughts last week so it was difficult for him to get activated.  I inquired what prevented him from harming himself, and he reports that suicide is against his values.  We agreed that given suicide is so contradictory to his values, perhaps there is a way to except that this feeling may come along at times, but to continue to power through to behavioral activating tasks and things that would be helping him remain productive.  He continues on Prozac and I suggested we use a low-dose of atypical antipsychotic, Rexulti.  He had had some insomnia with Abilify, so perhaps Rexulti may have a reduced likelihood of eliciting insomnia.  He continues to work with his individual therapist weekly.  I suggested he consider discussing  some of the recommendations today with his therapist as well.  Visit Diagnosis:    ICD-10-CM   1. Severe recurrent major depression without psychotic features (HCC) F33.2 FLUoxetine (PROZAC) 40 MG capsule  2. Social anxiety disorder F40.10 FLUoxetine (PROZAC) 40 MG capsule  3. Recurrent major depressive disorder, in partial remission (HCC) F33.41 FLUoxetine (PROZAC) 40 MG capsule    Past Psychiatric History: See intake H&P for full details. Reviewed, with no updates at this time.   Past Medical History:  Past Medical History:  Diagnosis Date  . Anxiety   . Cholesteatoma   . Depression     Past Surgical History:  Procedure Laterality Date  . INCISION AND DRAINAGE ABSCESS     when he was 51 months old  . SCROTAL EXPLORATION     when he was 1 day old    Family Psychiatric History: See intake H&P for full details. Reviewed, with no updates at this time.   Family History:  Family History  Problem Relation Age of Onset  . Depression Mother     Social History:  Social History   Socioeconomic History  . Marital status: Single    Spouse name: None  . Number of children: None  . Years of education: None  . Highest education level: None  Social Needs  . Financial resource strain: Somewhat hard  . Food insecurity - worry: Never true  . Food insecurity - inability: Never true  . Transportation needs - medical: No  . Transportation needs - non-medical: No  Occupational History  .  None  Tobacco Use  . Smoking status: Never Smoker  . Smokeless tobacco: Never Used  Substance and Sexual Activity  . Alcohol use: No  . Drug use: No  . Sexual activity: No  Other Topics Concern  . None  Social History Narrative  . None    Allergies: No Known Allergies  Metabolic Disorder Labs: No results found for: HGBA1C, MPG No results found for: PROLACTIN No results found for: CHOL, TRIG, HDL, CHOLHDL, VLDL, LDLCALC No results found for: TSH  Therapeutic Level Labs: No results  found for: LITHIUM No results found for: VALPROATE No components found for:  CBMZ  Current Medications: Current Outpatient Medications  Medication Sig Dispense Refill  . FLUoxetine (PROZAC) 40 MG capsule Take 2 capsules (80 mg total) by mouth daily. 180 capsule 1  . Brexpiprazole (REXULTI) 1 MG TABS Take 1 tablet (1 mg total) by mouth every morning. 90 tablet 1   No current facility-administered medications for this visit.      Musculoskeletal: Strength & Muscle Tone: within normal limits Gait & Station: normal Patient leans: N/A  Psychiatric Specialty Exam: ROS  Blood pressure 126/72, pulse 89, height '5\' 8"'$  (1.727 m), weight 219 lb (99.3 kg).Body mass index is 33.3 kg/m.  General Appearance: Casual and Fairly Groomed  Eye Contact:  Fair  Speech:  Normal Rate  Volume:  Normal  Mood:  Depressed and Dysphoric  Affect:  Appropriate and Flat  Thought Process:  Goal Directed and Descriptions of Associations: Intact  Orientation:  Full (Time, Place, and Person)  Thought Content: Logical   Suicidal Thoughts:  No  Homicidal Thoughts:  No  Memory:  Immediate;   Fair  Judgement:  Fair  Insight:  Shallow  Psychomotor Activity:  Normal  Concentration:  Concentration: Fair  Recall:  [place] of Knowledge: Fair  Language: Good  Akathisia:  Negative  Handed:  Right  AIMS (if indicated): not done  Assets:  Ambulance person  ADL's:  Intact  Cognition: WNL  Sleep:  Good   Screenings: PHQ2-9     Office Visit from 12/24/2017 in [place] Office Visit from 09/01/2017 in [place] Office Visit from 06/25/2017 in [place] Office Visit from 09/02/2016 in Primary Care at [facility] Total Score  2  0  1  4  PHQ-9 Total Score  No data  No data  No data  14       Assessment and Plan:  Arthur Dodson presents for medication management.  Unfortunately he has gotten stuck into a  pattern of withdrawal and isolation in the face of any discouraging or depressing reminders.  He presents with ongoing personality disordered features, and general difficulty in coping with stressors.  He acknowledges that he does not engage in behaviors that he know he can and should do in order to improve his own well-being.  I spent time with the patient discussing the integral value of behavioral activation, and working on some element of acceptance that he may not always be able to fully be free of depressive symptoms, but can learn to live with their intrusion into his psyche.  He has been tried on a myriad of psychiatric medications, and participated in the intensive outpatient program and PHP.  He tends to utilize these programs as methods of retreat and unfortunately they have come to be somewhat maladaptive for the patient.  I spent time with him discussing the conflicted role  of a psychiatrist in his care to both pushed him forward, while also attempting to provide some relief of his chronic dysphoria.  He does not have any acute suicidality or safety issues.  He continues to be engaged in therapy.  1. Severe recurrent major depression without psychotic features ([place])   2. Social anxiety disorder   3. Recurrent major depressive disorder, in partial remission ([place])     Status of current problems: unchanged  Labs Ordered: No orders of the defined types were placed in this encounter.   Labs Reviewed: n/a  Collateral Obtained/Records Reviewed: n/a  Plan:  Continue Prozac 80 mg daily Initiate Rexulti 0.5 mg, increase to 1 mg in 1 week Patient has failed Abilify 2 mg in the past due to increase in insomnia  I spent 40 minutes with the patient in direct face-to-face clinical care.  Greater than 50% of this time was spent in counseling and coordination of care with the patient.    Aundra Dubin, MD 01/26/2018, 10:59 AM

## 2018-02-01 ENCOUNTER — Ambulatory Visit (INDEPENDENT_AMBULATORY_CARE_PROVIDER_SITE_OTHER): Payer: BLUE CROSS/BLUE SHIELD | Admitting: Clinical

## 2018-02-01 DIAGNOSIS — F331 Major depressive disorder, recurrent, moderate: Secondary | ICD-10-CM | POA: Diagnosis not present

## 2018-02-08 ENCOUNTER — Ambulatory Visit (INDEPENDENT_AMBULATORY_CARE_PROVIDER_SITE_OTHER): Payer: BLUE CROSS/BLUE SHIELD | Admitting: Clinical

## 2018-02-08 DIAGNOSIS — F840 Autistic disorder: Secondary | ICD-10-CM

## 2018-02-11 ENCOUNTER — Encounter (HOSPITAL_COMMUNITY): Payer: Self-pay | Admitting: Psychiatry

## 2018-02-15 ENCOUNTER — Ambulatory Visit (INDEPENDENT_AMBULATORY_CARE_PROVIDER_SITE_OTHER): Payer: BLUE CROSS/BLUE SHIELD | Admitting: Clinical

## 2018-02-15 DIAGNOSIS — F331 Major depressive disorder, recurrent, moderate: Secondary | ICD-10-CM | POA: Diagnosis not present

## 2018-02-21 ENCOUNTER — Ambulatory Visit (INDEPENDENT_AMBULATORY_CARE_PROVIDER_SITE_OTHER): Payer: BLUE CROSS/BLUE SHIELD | Admitting: Clinical

## 2018-02-21 ENCOUNTER — Encounter: Payer: Self-pay | Admitting: Clinical

## 2018-02-21 DIAGNOSIS — F331 Major depressive disorder, recurrent, moderate: Secondary | ICD-10-CM

## 2018-02-22 ENCOUNTER — Ambulatory Visit: Payer: BLUE CROSS/BLUE SHIELD | Admitting: Clinical

## 2018-03-01 ENCOUNTER — Ambulatory Visit (INDEPENDENT_AMBULATORY_CARE_PROVIDER_SITE_OTHER): Payer: BLUE CROSS/BLUE SHIELD | Admitting: Clinical

## 2018-03-01 DIAGNOSIS — F331 Major depressive disorder, recurrent, moderate: Secondary | ICD-10-CM

## 2018-03-03 DIAGNOSIS — B349 Viral infection, unspecified: Secondary | ICD-10-CM | POA: Diagnosis not present

## 2018-03-03 DIAGNOSIS — R51 Headache: Secondary | ICD-10-CM | POA: Diagnosis not present

## 2018-03-08 ENCOUNTER — Ambulatory Visit: Payer: BLUE CROSS/BLUE SHIELD | Admitting: Clinical

## 2018-03-09 ENCOUNTER — Ambulatory Visit (INDEPENDENT_AMBULATORY_CARE_PROVIDER_SITE_OTHER): Payer: BLUE CROSS/BLUE SHIELD | Admitting: Clinical

## 2018-03-09 DIAGNOSIS — F333 Major depressive disorder, recurrent, severe with psychotic symptoms: Secondary | ICD-10-CM

## 2018-03-15 ENCOUNTER — Ambulatory Visit: Payer: BLUE CROSS/BLUE SHIELD | Admitting: Clinical

## 2018-03-15 DIAGNOSIS — F331 Major depressive disorder, recurrent, moderate: Secondary | ICD-10-CM | POA: Diagnosis not present

## 2018-03-16 DIAGNOSIS — F331 Major depressive disorder, recurrent, moderate: Secondary | ICD-10-CM

## 2018-03-22 ENCOUNTER — Ambulatory Visit: Payer: BLUE CROSS/BLUE SHIELD | Admitting: Clinical

## 2018-03-22 DIAGNOSIS — F331 Major depressive disorder, recurrent, moderate: Secondary | ICD-10-CM | POA: Diagnosis not present

## 2018-03-24 ENCOUNTER — Ambulatory Visit (HOSPITAL_COMMUNITY): Payer: BLUE CROSS/BLUE SHIELD | Admitting: Psychiatry

## 2018-03-24 ENCOUNTER — Encounter (HOSPITAL_COMMUNITY): Payer: Self-pay | Admitting: Psychiatry

## 2018-03-24 VITALS — BP 132/78 | HR 101 | Ht 68.0 in | Wt 225.2 lb

## 2018-03-24 DIAGNOSIS — Z818 Family history of other mental and behavioral disorders: Secondary | ICD-10-CM

## 2018-03-24 DIAGNOSIS — F411 Generalized anxiety disorder: Secondary | ICD-10-CM | POA: Diagnosis not present

## 2018-03-24 DIAGNOSIS — F332 Major depressive disorder, recurrent severe without psychotic features: Secondary | ICD-10-CM | POA: Diagnosis not present

## 2018-03-24 DIAGNOSIS — F4010 Social phobia, unspecified: Secondary | ICD-10-CM

## 2018-03-24 MED ORDER — BREXPIPRAZOLE 2 MG PO TABS: 2.0000 mg | ORAL_TABLET | Freq: Every day | ORAL | 0 refills | Status: DC

## 2018-03-24 NOTE — Progress Notes (Signed)
[facility] MD/PA/NP OP Progress Note  03/24/2018 11:24 AM Arthur Dodson  MRN:  [id number]  Chief Complaint: med management HPI: [name] reports that he has had some interval improvement in his mood, and he has been able to come out of depressive episodes a bit easier.  He reports that he does not feel as hopeless, and stuck, but there is room for improvement.  He is working diligently with Dr. Dewayne Hatch, and reports that this is been very helpful to set concrete goals spent time discussing his.  Ongoing issues of self-esteem, and also discussed a recent episode of very superficial cutting of his left forearm.  He reports that he has had 2 episodes of cutting within a 2-week period of time where he was more depressed last month, but he was able to use coping strategies including behavioral activation and reaching out to providers/therapist in order to remain safe and feels proud that he was able to get himself out of that episode.  We agreed to increase Rexulti to 2 mg for continued augmentation of Prozac 80 mg.  Reiterated the risks and benefits of atypical antipsychotic and we agreed to follow-up in 4-6 weeks  Visit Diagnosis:    ICD-10-CM   1. Severe recurrent major depression without psychotic features (HCC) F33.2 Brexpiprazole (REXULTI) 2 MG TABS  2. Social anxiety disorder F40.10 Brexpiprazole (REXULTI) 2 MG TABS  3. Generalized anxiety disorder F41.1     Past Psychiatric History: See intake H&P for full details. Reviewed, with no updates at this time.   Past Medical History:  Past Medical History:  Diagnosis Date  . Anxiety   . Cholesteatoma   . Depression     Past Surgical History:  Procedure Laterality Date  . INCISION AND DRAINAGE ABSCESS     when he was 50 months old  . SCROTAL EXPLORATION     when he was 1 day old    Family Psychiatric History: See intake H&P for full details. Reviewed, with no updates at this time.   Family History:  Family History  Problem  Relation Age of Onset  . Depression Mother     Social History:  Social History   Socioeconomic History  . Marital status: Single    Spouse name: Not on file  . Number of children: Not on file  . Years of education: Not on file  . Highest education level: Not on file  Occupational History  . Not on file  Social Needs  . Financial resource strain: Somewhat hard  . Food insecurity:    Worry: Never true    Inability: Never true  . Transportation needs:    Medical: No    Non-medical: No  Tobacco Use  . Smoking status: Never Smoker  . Smokeless tobacco: Never Used  Substance and Sexual Activity  . Alcohol use: No  . Drug use: No  . Sexual activity: Never  Lifestyle  . Physical activity:    Days per week: 0 days    Minutes per session: 0 min  . Stress: Rather much  Relationships  . Social connections:    Talks on phone: Never    Gets together: Never    Attends religious service: Never    Active member of club or organization: No    Attends meetings of clubs or organizations: Never    Relationship status: Not on file  Other Topics Concern  . Not on file  Social History Narrative  . Not on file    Allergies: No  Known Allergies  Metabolic Disorder Labs: No results found for: HGBA1C, MPG No results found for: PROLACTIN No results found for: CHOL, TRIG, HDL, CHOLHDL, VLDL, LDLCALC No results found for: TSH  Therapeutic Level Labs: No results found for: LITHIUM No results found for: VALPROATE No components found for:  CBMZ  Current Medications: Current Outpatient Medications  Medication Sig Dispense Refill  . FLUoxetine (PROZAC) 40 MG capsule Take 2 capsules (80 mg total) by mouth daily. 180 capsule 1  . Brexpiprazole (REXULTI) 2 MG TABS Take 2 mg by mouth daily. 90 tablet 0   No current facility-administered medications for this visit.      Musculoskeletal: Strength & Muscle Tone: within normal limits Gait & Station: normal Patient leans:  N/A  Psychiatric Specialty Exam: ROS  Blood pressure 132/78, pulse (!) 101, height 5\' 8"  (1.727 m), weight 225 lb 3.2 oz (102.2 kg).Body mass index is 34.24 kg/m.  General Appearance: Casual and Fairly Groomed  Eye Contact:  Good  Speech:  Clear and Coherent and Normal Rate  Volume:  Normal  Mood:  Dysphoric and looks less depressed from before  Affect:  Appropriate and Congruent  Thought Process:  Goal Directed and Descriptions of Associations: Intact  Orientation:  Full (Time, Place, and Person)  Thought Content: Logical   Suicidal Thoughts:  No  Homicidal Thoughts:  No  Memory:  Immediate;   Fair  Judgement:  Fair  Insight:  Fair  Psychomotor Activity:  Normal  Concentration:  Attention Span: Good  Recall:  Good  Fund of Knowledge: Good  Language: Good  Akathisia:  Negative  Handed:  Right  AIMS (if indicated): 0/10   Assets:  Communication Skills Desire for Improvement Financial Resources/Insurance Housing Transportation  ADL's:  Intact  Cognition: WNL  Sleep:  Fair   Screenings: PHQ2-9     Office Visit from 12/24/2017 in [facility] Office Visit from 09/01/2017 in [facility] Office Visit from 06/25/2017 in [facility] Office Visit from 09/02/2016 in Primary Care at [facility] Total Score  2  0  1  4  PHQ-9 Total Score  -  -  -  14       Assessment and Plan:  Baley Dodson continues with depressive symptoms, but does appear to be making some trend towards improvement.  He has been working on very concrete behavioral activation, and concrete goals in therapy.  We spent time discussing today the goals of his improvement and increase in his overall resilience as an individual.  We discussed increase of Rexulti to provide some relief to depressive symptoms, with a goal in mind that he will continue to push himself rather than remaining complacent or returning into a level of comfort which is not very  adaptive for him.  He did have 2 episodes of cutting last month, they were superficial cuts to his left forearm, and he immediately reached out to crisis support, engaged in activating behaviors, and coping strategies, and reports that the depressive symptoms passed after about 2 weeks.  This is much shorter than his prior flares and depression and mood symptoms, and I do believe Rexulti is providing benefit, patient agrees.  1. Severe recurrent major depression without psychotic features (HCC)   2. Social anxiety disorder   3. Generalized anxiety disorder     Status of current problems: gradually improving  Labs Ordered: No orders of the defined types were placed in this encounter.   Labs  Reviewed: n/a  Collateral Obtained/Records Reviewed: n/a  Plan:  Increase brexpiprazole to 2 mg Continue Prozac 80 mg rtc 4-6 weeks  I spent 25 minutes with the patient in direct face-to-face clinical care.  Greater than 50% of this time was spent in counseling and coordination of care with the patient.    Burnard LeighAlexander Arya Eksir, MD 03/24/2018, 11:24 AM

## 2018-04-05 ENCOUNTER — Ambulatory Visit: Payer: BLUE CROSS/BLUE SHIELD | Admitting: Clinical

## 2018-04-05 DIAGNOSIS — F331 Major depressive disorder, recurrent, moderate: Secondary | ICD-10-CM

## 2018-04-08 ENCOUNTER — Ambulatory Visit (INDEPENDENT_AMBULATORY_CARE_PROVIDER_SITE_OTHER): Payer: BLUE CROSS/BLUE SHIELD | Admitting: Clinical

## 2018-04-08 DIAGNOSIS — F331 Major depressive disorder, recurrent, moderate: Secondary | ICD-10-CM

## 2018-04-12 ENCOUNTER — Ambulatory Visit: Payer: BLUE CROSS/BLUE SHIELD | Admitting: Clinical

## 2018-04-12 DIAGNOSIS — F331 Major depressive disorder, recurrent, moderate: Secondary | ICD-10-CM

## 2018-04-26 ENCOUNTER — Ambulatory Visit: Payer: BLUE CROSS/BLUE SHIELD | Admitting: Clinical

## 2018-04-26 ENCOUNTER — Encounter: Payer: Self-pay | Admitting: Clinical

## 2018-04-26 DIAGNOSIS — F331 Major depressive disorder, recurrent, moderate: Secondary | ICD-10-CM

## 2018-05-04 ENCOUNTER — Ambulatory Visit: Payer: BLUE CROSS/BLUE SHIELD | Admitting: Clinical

## 2018-05-04 DIAGNOSIS — F331 Major depressive disorder, recurrent, moderate: Secondary | ICD-10-CM

## 2018-05-10 ENCOUNTER — Ambulatory Visit: Payer: BLUE CROSS/BLUE SHIELD | Admitting: Clinical

## 2018-05-10 DIAGNOSIS — F331 Major depressive disorder, recurrent, moderate: Secondary | ICD-10-CM | POA: Diagnosis not present

## 2018-05-18 ENCOUNTER — Ambulatory Visit: Payer: BLUE CROSS/BLUE SHIELD | Admitting: Clinical

## 2018-05-18 DIAGNOSIS — F331 Major depressive disorder, recurrent, moderate: Secondary | ICD-10-CM

## 2018-05-19 ENCOUNTER — Ambulatory Visit (HOSPITAL_COMMUNITY): Payer: BLUE CROSS/BLUE SHIELD | Admitting: Psychiatry

## 2018-05-20 ENCOUNTER — Ambulatory Visit (HOSPITAL_COMMUNITY): Payer: BLUE CROSS/BLUE SHIELD | Admitting: Psychiatry

## 2018-05-20 ENCOUNTER — Encounter (HOSPITAL_COMMUNITY): Payer: Self-pay | Admitting: Psychiatry

## 2018-05-20 DIAGNOSIS — Z818 Family history of other mental and behavioral disorders: Secondary | ICD-10-CM

## 2018-05-20 DIAGNOSIS — G4719 Other hypersomnia: Secondary | ICD-10-CM | POA: Diagnosis not present

## 2018-05-20 DIAGNOSIS — F4010 Social phobia, unspecified: Secondary | ICD-10-CM | POA: Diagnosis not present

## 2018-05-20 DIAGNOSIS — F332 Major depressive disorder, recurrent severe without psychotic features: Secondary | ICD-10-CM

## 2018-05-20 DIAGNOSIS — R5382 Chronic fatigue, unspecified: Secondary | ICD-10-CM | POA: Diagnosis not present

## 2018-05-20 DIAGNOSIS — F411 Generalized anxiety disorder: Secondary | ICD-10-CM

## 2018-05-20 MED ORDER — BREXPIPRAZOLE 2 MG PO TABS: 2.0000 mg | ORAL_TABLET | Freq: Every day | ORAL | 0 refills | Status: DC

## 2018-05-20 MED ORDER — VENLAFAXINE HCL ER 150 MG PO CP24: 150.0000 mg | ORAL_CAPSULE | Freq: Every day | ORAL | 0 refills | Status: DC

## 2018-05-20 MED ORDER — VENLAFAXINE HCL ER 37.5 MG PO CP24: ORAL_CAPSULE | ORAL | 0 refills | Status: DC

## 2018-05-20 NOTE — Progress Notes (Addendum)
[facility] MD/PA/NP OP Progress Note  05/20/2018 10:06 AM Arthur Dodson  MRN:  [id number]  Chief Complaint:  still some fatigue, depression  HPI: Arthur Dodson presents with ongoing fatigue, never feels rested from sleep. Ongoing dysphoria, poor self esteem.  Has early AM awakenings at 4-5 AM. No active SI or safety issues and continues to work with Dr. Dewayne Hatch.  Discussed a switch from prozac to SNRI effexor and reviewed common side effects including nausea, headaches, hypertension, diarrhea.  Agreed that risks outweigh benefits and discussed titration as below.  Will continue rexulti 2 mg given some slight benefits and wish to make one change at a time regarding medications.  Discussed sleep concerns aside from early awakenings and given his long history of snoring, I suggest sleep study to assess for apnea.  He does not have a partner/girlfriend, so unsure if he is having apneic episodes otherwise.    Visit Diagnosis:    ICD-10-CM   1. Generalized anxiety disorder F41.1 venlafaxine XR (EFFEXOR XR) 37.5 MG 24 hr capsule  2. Chronic fatigue R53.82 Home sleep test    venlafaxine XR (EFFEXOR XR) 150 MG 24 hr capsule  3. Excessive daytime sleepiness G47.19 Home sleep test    venlafaxine XR (EFFEXOR XR) 150 MG 24 hr capsule  4. Social anxiety disorder F40.10 venlafaxine XR (EFFEXOR XR) 37.5 MG 24 hr capsule    venlafaxine XR (EFFEXOR XR) 150 MG 24 hr capsule    Brexpiprazole (REXULTI) 2 MG TABS  5. Severe recurrent major depression without psychotic features (HCC) F33.2 venlafaxine XR (EFFEXOR XR) 37.5 MG 24 hr capsule    Brexpiprazole (REXULTI) 2 MG TABS   Past Psychiatric History: See intake H&P for full details. Reviewed, with no updates at this time.  Past Medical History:  Past Medical History:  Diagnosis Date  . Anxiety   . Cholesteatoma   . Depression     Past Surgical History:  Procedure Laterality Date  . INCISION AND DRAINAGE ABSCESS     when he was 20 months old  .  SCROTAL EXPLORATION     when he was 1 day old    Family Psychiatric History: See intake H&P for full details. Reviewed, with no updates at this time.   Family History:  Family History  Problem Relation Age of Onset  . Depression Mother     Social History:  Social History   Socioeconomic History  . Marital status: Single    Spouse name: Not on file  . Number of children: Not on file  . Years of education: Not on file  . Highest education level: Not on file  Occupational History  . Not on file  Social Needs  . Financial resource strain: Somewhat hard  . Food insecurity:    Worry: Never true    Inability: Never true  . Transportation needs:    Medical: No    Non-medical: No  Tobacco Use  . Smoking status: Never Smoker  . Smokeless tobacco: Never Used  Substance and Sexual Activity  . Alcohol use: No  . Drug use: No  . Sexual activity: Never  Lifestyle  . Physical activity:    Days per week: 0 days    Minutes per session: 0 min  . Stress: Rather much  Relationships  . Social connections:    Talks on phone: Never    Gets together: Never    Attends religious service: Never    Active member of club or organization: No    Attends meetings  of clubs or organizations: Never    Relationship status: Not on file  Other Topics Concern  . Not on file  Social History Narrative  . Not on file    Allergies: No Known Allergies  Metabolic Disorder Labs: No results found for: HGBA1C, MPG No results found for: PROLACTIN No results found for: CHOL, TRIG, HDL, CHOLHDL, VLDL, LDLCALC No results found for: TSH  Therapeutic Level Labs: No results found for: LITHIUM No results found for: VALPROATE No components found for:  CBMZ  Current Medications: Current Outpatient Medications  Medication Sig Dispense Refill  . Brexpiprazole (REXULTI) 2 MG TABS Take 2 mg by mouth daily. 90 tablet 0  . [START ON 06/08/2018] venlafaxine XR (EFFEXOR XR) 150 MG 24 hr capsule Take 1 capsule  (150 mg total) by mouth daily with breakfast. 90 capsule 0  . venlafaxine XR (EFFEXOR XR) 37.5 MG 24 hr capsule Take 1 capsule (37.5 mg total) by mouth daily for 10 days, THEN 2 capsules (75 mg total) daily for 10 days. 30 capsule 0   No current facility-administered medications for this visit.      Musculoskeletal: Strength & Muscle Tone: within normal limits Gait & Station: normal Patient leans: N/A  Psychiatric Specialty Exam: ROS  There were no vitals taken for this visit.There is no height or weight on file to calculate BMI.  General Appearance: Casual and Fairly Groomed  Eye Contact:  Good  Speech:  Clear and Coherent and Normal Rate  Volume:  Normal  Mood:  Dysphoric  Affect:  Appropriate and Congruent  Thought Process:  Goal Directed and Descriptions of Associations: Intact  Orientation:  Full (Time, Place, and Person)  Thought Content: Logical   Suicidal Thoughts:  No  Homicidal Thoughts:  No  Memory:  Immediate;   Good  Judgement:  Fair  Insight:  Good  Psychomotor Activity:  Normal  Concentration:  Concentration: Good  Recall:  Good  Fund of Knowledge: Good  Language: Good  Akathisia:  Negative  Handed:  Right  AIMS (if indicated): not done  Assets:  Communication Skills Desire for Improvement Financial Resources/Insurance Housing Social Support Transportation Vocational/Educational  ADL's:  Intact  Cognition: WNL  Sleep:  early AM awakenings   Screenings: PHQ2-9     Office Visit from 12/24/2017 in [facility] Office Visit from 09/01/2017 in [facility] Office Visit from 06/25/2017 in [facility] Office Visit from 09/02/2016 in Primary Care at [facility] Total Score  2  0  1  4  PHQ-9 Total Score  -  -  -  14       Assessment and Plan:  Arthur Dodson continues with social anxiety, depression and early AM awakenings.  No active SI and he is engaged in therapy regularly.   I believe  he would benefit from a switch to SNRI given that augmenting strategies have failed to provide robust response.  He has also recently switched jobs to a less stressful part-time position at [employer].  He started 2 weeks ago and is hopeful that he will find a comfortable routine and enjoy the work there. Will rtc 6 weeks.  1. Generalized anxiety disorder   2. Chronic fatigue   3. Excessive daytime sleepiness   4. Social anxiety disorder   5. Severe recurrent major depression without psychotic features (HCC)     Status of current problems: unchanged  Labs Ordered: Orders Placed This Encounter  Procedures  .  Home sleep test    Standing Status:   Future    Standing Expiration Date:   05/21/2019    Order Specific Question:   Where should this test be performed:    Answer[facility]    Labs Reviewed: n/a  Collateral Obtained/Records Reviewed: n/a  Plan:  Discontinue prozac - 16 days metabolically active halflife Start Effexor XR 37.5 mg daily X 10 days, then 75 mg X 10 days After 20 days, increase effexor to 150 mg XR daily Reviewed symptoms of serotonin syndrome to be mindful of polysomnography to assess for apnea given continuous snoring Continue rexulti 2 mg rtc 6 weeks   Burnard LeighAlexander Arya Eksir, MD 05/20/2018, 10:06 AM

## 2018-05-25 ENCOUNTER — Ambulatory Visit (INDEPENDENT_AMBULATORY_CARE_PROVIDER_SITE_OTHER): Payer: BLUE CROSS/BLUE SHIELD | Admitting: Clinical

## 2018-05-25 DIAGNOSIS — F331 Major depressive disorder, recurrent, moderate: Secondary | ICD-10-CM

## 2018-06-01 ENCOUNTER — Ambulatory Visit (INDEPENDENT_AMBULATORY_CARE_PROVIDER_SITE_OTHER): Payer: BLUE CROSS/BLUE SHIELD | Admitting: Clinical

## 2018-06-01 DIAGNOSIS — F331 Major depressive disorder, recurrent, moderate: Secondary | ICD-10-CM | POA: Diagnosis not present

## 2018-06-08 ENCOUNTER — Ambulatory Visit (INDEPENDENT_AMBULATORY_CARE_PROVIDER_SITE_OTHER): Payer: BLUE CROSS/BLUE SHIELD | Admitting: Clinical

## 2018-06-08 DIAGNOSIS — F331 Major depressive disorder, recurrent, moderate: Secondary | ICD-10-CM | POA: Diagnosis not present

## 2018-06-15 ENCOUNTER — Ambulatory Visit: Payer: BLUE CROSS/BLUE SHIELD | Admitting: Clinical

## 2018-06-15 DIAGNOSIS — F331 Major depressive disorder, recurrent, moderate: Secondary | ICD-10-CM

## 2018-06-22 ENCOUNTER — Ambulatory Visit: Payer: BLUE CROSS/BLUE SHIELD | Admitting: Clinical

## 2018-06-22 DIAGNOSIS — F331 Major depressive disorder, recurrent, moderate: Secondary | ICD-10-CM | POA: Diagnosis not present

## 2018-06-29 ENCOUNTER — Ambulatory Visit: Payer: BLUE CROSS/BLUE SHIELD | Admitting: Clinical

## 2018-06-29 DIAGNOSIS — F331 Major depressive disorder, recurrent, moderate: Secondary | ICD-10-CM | POA: Diagnosis not present

## 2018-06-30 DIAGNOSIS — R112 Nausea with vomiting, unspecified: Secondary | ICD-10-CM | POA: Diagnosis not present

## 2018-06-30 DIAGNOSIS — R1010 Upper abdominal pain, unspecified: Secondary | ICD-10-CM | POA: Diagnosis not present

## 2018-07-05 ENCOUNTER — Encounter (HOSPITAL_COMMUNITY): Payer: Self-pay | Admitting: Psychiatry

## 2018-07-05 ENCOUNTER — Ambulatory Visit (HOSPITAL_COMMUNITY): Payer: BLUE CROSS/BLUE SHIELD | Admitting: Psychiatry

## 2018-07-05 VITALS — BP 132/78 | HR 74 | Ht 68.0 in | Wt 221.0 lb

## 2018-07-05 DIAGNOSIS — R5382 Chronic fatigue, unspecified: Secondary | ICD-10-CM | POA: Diagnosis not present

## 2018-07-05 DIAGNOSIS — F331 Major depressive disorder, recurrent, moderate: Secondary | ICD-10-CM

## 2018-07-05 DIAGNOSIS — F4010 Social phobia, unspecified: Secondary | ICD-10-CM

## 2018-07-05 DIAGNOSIS — G4719 Other hypersomnia: Secondary | ICD-10-CM | POA: Diagnosis not present

## 2018-07-05 MED ORDER — VENLAFAXINE HCL ER 150 MG PO CP24: 300.0000 mg | ORAL_CAPSULE | Freq: Every day | ORAL | 0 refills | Status: DC

## 2018-07-05 NOTE — Progress Notes (Signed)
BH MD/PA/NP OP Progress Note  07/05/2018 10:59 AM Arthur Dodson  MRN:  [id number]  Chief Complaint:  still some fatigue, depression  HPI: Arthur Dodson continues with snoring, excessive fatigue, depressed mood. Discussed the need for sleep study and he agrees to this.  Effexor has agreed with him, but has some breathrough symptoms.  Discussed increase to 300 mg XR and he was receptive to this. Continues in therapy with Dr. Dewayne HatchMendelson and is working on some personal/profesional goals.  Visit Diagnosis:    ICD-10-CM   1. Moderate recurrent major depression (HCC) F33.1 Polysomnography 4 or more parameters  2. Chronic fatigue R53.82 venlafaxine XR (EFFEXOR XR) 150 MG 24 hr capsule    Polysomnography 4 or more parameters  3. Excessive daytime sleepiness G47.19 venlafaxine XR (EFFEXOR XR) 150 MG 24 hr capsule    Polysomnography 4 or more parameters  4. Social anxiety disorder F40.10 venlafaxine XR (EFFEXOR XR) 150 MG 24 hr capsule     Past Psychiatric History: See intake H&P for full details. Reviewed, with no updates at this time.  Past Medical History:  Past Medical History:  Diagnosis Date  . Anxiety   . Cholesteatoma   . Depression     Past Surgical History:  Procedure Laterality Date  . INCISION AND DRAINAGE ABSCESS     when he was 436 months old  . SCROTAL EXPLORATION     when he was 1 day old    Family Psychiatric History: See intake H&P for full details. Reviewed, with no updates at this time.   Family History:  Family History  Problem Relation Age of Onset  . Depression Mother     Social History:  Social History   Socioeconomic History  . Marital status: Single    Spouse name: Not on file  . Number of children: Not on file  . Years of education: Not on file  . Highest education level: Not on file  Occupational History  . Not on file  Social Needs  . Financial resource strain: Somewhat hard  . Food insecurity:    Worry: Never true    Inability: Never  true  . Transportation needs:    Medical: No    Non-medical: No  Tobacco Use  . Smoking status: Never Smoker  . Smokeless tobacco: Never Used  Substance and Sexual Activity  . Alcohol use: No  . Drug use: No  . Sexual activity: Never  Lifestyle  . Physical activity:    Days per week: 0 days    Minutes per session: 0 min  . Stress: Rather much  Relationships  . Social connections:    Talks on phone: Never    Gets together: Never    Attends religious service: Never    Active member of club or organization: No    Attends meetings of clubs or organizations: Never    Relationship status: Not on file  Other Topics Concern  . Not on file  Social History Narrative  . Not on file    Allergies: No Known Allergies  Metabolic Disorder Labs: No results found for: HGBA1C, MPG No results found for: PROLACTIN No results found for: CHOL, TRIG, HDL, CHOLHDL, VLDL, LDLCALC No results found for: TSH  Therapeutic Level Labs: No results found for: LITHIUM No results found for: VALPROATE No components found for:  CBMZ  Current Medications: Current Outpatient Medications  Medication Sig Dispense Refill  . Brexpiprazole (REXULTI) 2 MG TABS Take 2 mg by mouth daily. 90 tablet 0  .  ondansetron (ZOFRAN ODT) 8 MG disintegrating tablet Take by mouth.    . venlafaxine XR (EFFEXOR XR) 150 MG 24 hr capsule Take 2 capsules (300 mg total) by mouth daily with breakfast. 180 capsule 0   No current facility-administered medications for this visit.    Musculoskeletal: Strength & Muscle Tone: within normal limits Gait & Station: normal Patient leans: N/A  Psychiatric Specialty Exam: ROS  Blood pressure 132/78, pulse 74, height 5\' 8"  (1.727 m), weight 221 lb (100.2 kg).Body mass index is 33.6 kg/m.  General Appearance: Casual and Fairly Groomed  Eye Contact:  Good  Speech:  Clear and Coherent and Normal Rate  Volume:  Normal  Mood:  Dysphoric  Affect:  Appropriate and Congruent  Thought  Process:  Goal Directed and Descriptions of Associations: Intact  Orientation:  Full (Time, Place, and Person)  Thought Content: Logical   Suicidal Thoughts:  No  Homicidal Thoughts:  No  Memory:  Immediate;   Good  Judgement:  Fair  Insight:  Good  Psychomotor Activity:  Normal  Concentration:  Concentration: Good  Recall:  Good  Fund of Knowledge: Good  Language: Good  Akathisia:  Negative  Handed:  Right  AIMS (if indicated): not done  Assets:  Communication Skills Desire for Improvement Financial Resources/Insurance Housing Social Support Transportation Vocational/Educational  ADL's:  Intact  Cognition: WNL  Sleep:  early AM awakenings improving gradually    Screenings: PHQ2-9     Office Visit from 12/24/2017 in [facility] Office Visit from 09/01/2017 in [facility] Office Visit from 06/25/2017 in [facility] Office Visit from 09/02/2016 in [facility] Care at [facility] Total Score  2  0  1  4  PHQ-9 Total Score  -  -  -  14       Assessment and Plan:  Arthur Dodson continues with some mild depressive symptoms, and would benefit from an increase in the Effexor to 300 mg.  He continues to work on individual therapy goals with Dr. Dewayne Hatch.  No acute safety issues or suicidality and he feels like this has been improving.  Continues on Rexulti as below for augmentation and we will follow-up in 4-6 weeks for medication management.  1. Moderate recurrent major depression (HCC)   2. Chronic fatigue   3. Excessive daytime sleepiness   4. Social anxiety disorder     Status of current problems: unchanged  Labs Ordered: Orders Placed This Encounter  Procedures  . Polysomnography 4 or more parameters    Standing Status:   Future    Standing Expiration Date:   07/06/2019    Order Specific Question:   Where should this test be performed:    Answer:   [facility]    Labs Reviewed:  n/a  Collateral Obtained/Records Reviewed: n/a  Plan:  Effexor XR 300 mg daily Continue rexulti 2 mg RTC 6 weeks Continue individual therapy   Burnard Leigh, MD 07/05/2018, 10:59 AM

## 2018-07-06 ENCOUNTER — Ambulatory Visit (INDEPENDENT_AMBULATORY_CARE_PROVIDER_SITE_OTHER): Payer: BLUE CROSS/BLUE SHIELD | Admitting: Clinical

## 2018-07-06 DIAGNOSIS — F331 Major depressive disorder, recurrent, moderate: Secondary | ICD-10-CM

## 2018-07-13 ENCOUNTER — Ambulatory Visit (INDEPENDENT_AMBULATORY_CARE_PROVIDER_SITE_OTHER): Payer: BLUE CROSS/BLUE SHIELD | Admitting: Clinical

## 2018-07-13 DIAGNOSIS — F331 Major depressive disorder, recurrent, moderate: Secondary | ICD-10-CM

## 2018-07-20 ENCOUNTER — Ambulatory Visit (INDEPENDENT_AMBULATORY_CARE_PROVIDER_SITE_OTHER): Payer: BLUE CROSS/BLUE SHIELD | Admitting: Clinical

## 2018-07-20 DIAGNOSIS — F331 Major depressive disorder, recurrent, moderate: Secondary | ICD-10-CM

## 2018-07-27 ENCOUNTER — Ambulatory Visit (INDEPENDENT_AMBULATORY_CARE_PROVIDER_SITE_OTHER): Payer: BLUE CROSS/BLUE SHIELD | Admitting: Clinical

## 2018-07-27 DIAGNOSIS — F331 Major depressive disorder, recurrent, moderate: Secondary | ICD-10-CM | POA: Diagnosis not present

## 2018-08-03 ENCOUNTER — Ambulatory Visit (INDEPENDENT_AMBULATORY_CARE_PROVIDER_SITE_OTHER): Payer: BLUE CROSS/BLUE SHIELD | Admitting: Clinical

## 2018-08-03 DIAGNOSIS — F331 Major depressive disorder, recurrent, moderate: Secondary | ICD-10-CM | POA: Diagnosis not present

## 2018-08-17 ENCOUNTER — Ambulatory Visit (INDEPENDENT_AMBULATORY_CARE_PROVIDER_SITE_OTHER): Payer: BLUE CROSS/BLUE SHIELD | Admitting: Clinical

## 2018-08-17 DIAGNOSIS — F331 Major depressive disorder, recurrent, moderate: Secondary | ICD-10-CM | POA: Diagnosis not present

## 2018-08-24 ENCOUNTER — Ambulatory Visit (INDEPENDENT_AMBULATORY_CARE_PROVIDER_SITE_OTHER): Payer: BLUE CROSS/BLUE SHIELD | Admitting: Clinical

## 2018-08-24 DIAGNOSIS — F331 Major depressive disorder, recurrent, moderate: Secondary | ICD-10-CM

## 2018-08-31 ENCOUNTER — Ambulatory Visit (INDEPENDENT_AMBULATORY_CARE_PROVIDER_SITE_OTHER): Payer: BLUE CROSS/BLUE SHIELD | Admitting: Clinical

## 2018-08-31 DIAGNOSIS — F840 Autistic disorder: Secondary | ICD-10-CM | POA: Diagnosis not present

## 2018-09-07 ENCOUNTER — Ambulatory Visit (INDEPENDENT_AMBULATORY_CARE_PROVIDER_SITE_OTHER): Payer: BLUE CROSS/BLUE SHIELD | Admitting: Clinical

## 2018-09-07 DIAGNOSIS — F331 Major depressive disorder, recurrent, moderate: Secondary | ICD-10-CM | POA: Diagnosis not present

## 2018-09-14 ENCOUNTER — Ambulatory Visit: Payer: BLUE CROSS/BLUE SHIELD | Admitting: Clinical

## 2018-09-14 DIAGNOSIS — F331 Major depressive disorder, recurrent, moderate: Secondary | ICD-10-CM | POA: Diagnosis not present

## 2018-09-16 DIAGNOSIS — F900 Attention-deficit hyperactivity disorder, predominantly inattentive type: Secondary | ICD-10-CM | POA: Diagnosis not present

## 2018-09-21 ENCOUNTER — Ambulatory Visit (INDEPENDENT_AMBULATORY_CARE_PROVIDER_SITE_OTHER): Payer: BLUE CROSS/BLUE SHIELD | Admitting: Clinical

## 2018-09-21 DIAGNOSIS — F331 Major depressive disorder, recurrent, moderate: Secondary | ICD-10-CM | POA: Diagnosis not present

## 2018-09-24 ENCOUNTER — Encounter: Payer: Self-pay | Admitting: Emergency Medicine

## 2018-09-24 ENCOUNTER — Emergency Department
Admission: EM | Admit: 2018-09-24 | Discharge: 2018-09-24 | Disposition: A | Discharge status: Home / Self Care | Payer: BLUE CROSS/BLUE SHIELD | Source: Home / Self Care | Attending: Family Medicine | Admitting: Family Medicine

## 2018-09-24 DIAGNOSIS — R112 Nausea with vomiting, unspecified: Secondary | ICD-10-CM | POA: Diagnosis not present

## 2018-09-24 DIAGNOSIS — R197 Diarrhea, unspecified: Secondary | ICD-10-CM

## 2018-09-24 MED ORDER — ONDANSETRON HCL 4 MG PO TABS
4.0000 mg | ORAL_TABLET | Freq: Four times a day (QID) | ORAL | 0 refills | Status: DC
Start: 2018-09-24 — End: 2019-12-14

## 2018-09-24 NOTE — ED Triage Notes (Signed)
Patient c/o vomiting and diarrhea x 1 day, abdominal pressure, no fever.

## 2018-09-24 NOTE — Discharge Instructions (Signed)
°  Today you were evaluated in urgent care for abdominal pain, nausea, vomiting, and diarrhea.  Your exam, vital signs, and labs did not indicate any emergent process taking place at this time. Symptoms are likely due to gastroenteritis (stomach virus).  Be sure to get plenty of rest and stay well hydrated.  Avoid dairy products and fried foods while feeling nauseous and having diarrhea as these foods can make symptoms worse.  Take pain and nausea medication as prescribed (if any).  Follow up with primary in 2-3 days if symptoms not improving.  Please go to the emergency department if you experience NEW or worsening symptoms including unable to keep down fluids or severe abdominal pain.

## 2018-09-24 NOTE — ED Provider Notes (Signed)
[facility]    CSN: [id number] Arrival date & time: 09/24/18  1243     History   Chief Complaint Chief Complaint  Patient presents with  . Diarrhea    HPI Arthur Dodson is a 27 y.o. male.   HPI Arthur Dodson is a 27 y.o. male presenting to UC with c/o nausea, vomiting and diarrhea that started yesterday. Two episodes of vomiting with one this morning and about 5-6 episodes of diarrhea since yesterday.  Generalized abdominal cramping. Denies fever or chills. Denies URI symptoms. Others at work have also had GI symptoms recently. He has not tried anything for his symptoms yet.    Past Medical History:  Diagnosis Date  . Anxiety   . Cholesteatoma   . Depression     Patient Active Problem List   Diagnosis Date Noted  . Severe recurrent major depression without psychotic features (HCC) 10/22/2017    Class: Chronic  . Social anxiety disorder 10/22/2017    Class: Chronic  . Gastroenteritis 09/01/2017  . Congestion of nasal sinus 09/01/2017  . Obesity 06/25/2017  . Cholesteatoma   . Depression   . Generalized anxiety disorder   . Moderate recurrent major depression (HCC) 02/23/2017    Class: Chronic    Past Surgical History:  Procedure Laterality Date  . INCISION AND DRAINAGE ABSCESS     when he was 25 months old  . SCROTAL EXPLORATION     when he was 1 day old       Home Medications    Prior to Admission medications   Medication Sig Start Date End Date Taking? Authorizing Provider  amphetamine-dextroamphetamine (ADDERALL) 10 MG tablet  09/16/18  Yes [provider]  ondansetron (ZOFRAN) 4 MG tablet Take 1 tablet (4 mg total) by mouth every 6 (six) hours. 09/24/18   Lurene Shadow, PA-C    Family History Family History  Problem Relation Age of Onset  . Depression Mother     Social History Social History   Tobacco Use  . Smoking status: Never Smoker  . Smokeless tobacco: Never Used  Substance Use Topics  . Alcohol use: No   . Drug use: No     Allergies   Patient has no known allergies.   Review of Systems Review of Systems  Gastrointestinal: Positive for abdominal pain, diarrhea, nausea and vomiting. Negative for blood in stool.  Genitourinary: Negative for dysuria, frequency and hematuria.  Musculoskeletal: Negative for back pain.  Neurological: Negative for dizziness, light-headedness and headaches.     Physical Exam Triage Vital Signs ED Triage Vitals  Enc Vitals Group     BP 09/24/18 1305 113/81     Pulse Rate 09/24/18 1305 (!) 103     Resp --      Temp 09/24/18 1305 98.5 F (36.9 C)     Temp Source 09/24/18 1305 Oral     SpO2 09/24/18 1305 97 %     Weight 09/24/18 1306 225 lb 4 oz (102.2 kg)     Height 09/24/18 1306 5\' 8"  (1.727 m)     Head Circumference --      Peak Flow --      Pain Score 09/24/18 1306 0     Pain Loc --      Pain Edu? --      Excl. in GC? --    No data found.  Updated Vital Signs BP 113/81 (BP Location: Right Arm)   Pulse (!) 103   Temp 98.5 F (36.9  C) (Oral)   Ht 5\' 8"  (1.727 m)   Wt 225 lb 4 oz (102.2 kg)   SpO2 97%   BMI 34.25 kg/m   Visual Acuity Right Eye Distance:   Left Eye Distance:   Bilateral Distance:    Right Eye Near:   Left Eye Near:    Bilateral Near:     Physical Exam  Constitutional: He is oriented to person, place, and time. He appears well-developed and well-nourished. No distress.  HENT:  Head: Normocephalic and atraumatic.  Mouth/Throat: Oropharynx is clear and moist.  Eyes: EOM are normal.  Neck: Normal range of motion.  Cardiovascular: Normal rate and regular rhythm.  Pulmonary/Chest: Effort normal and breath sounds normal. No respiratory distress.  Abdominal: Soft. He exhibits no distension. Bowel sounds are increased. There is no tenderness. There is no CVA tenderness.  Musculoskeletal: Normal range of motion.  Neurological: He is alert and oriented to person, place, and time.  Skin: Skin is warm and dry. He is not  diaphoretic.  Psychiatric: He has a normal mood and affect. His behavior is normal.  Nursing note and vitals reviewed.    UC Treatments / Results  Labs (all labs ordered are listed, but only abnormal results are displayed) Labs Reviewed - No data to display  EKG None  Radiology No results found.  Procedures Procedures (including critical care time)  Medications Ordered in UC Medications - No data to display  Initial Impression / Assessment and Plan / UC Course  I have reviewed the triage vital signs and the nursing notes.  Pertinent labs & imaging results that were available during my care of the patient were reviewed by me and considered in my medical decision making (see chart for details).     Benign abdominal exam Hx and exam c/w viral illness Encouraged symptomatic treatment at this time.  Final Clinical Impressions(s) / UC Diagnoses   Final diagnoses:  Nausea vomiting and diarrhea     Discharge Instructions      Today you were evaluated in urgent care for abdominal pain, nausea, vomiting, and diarrhea.  Your exam, vital signs, and labs did not indicate any emergent process taking place at this time. Symptoms are likely due to gastroenteritis (stomach virus).  Be sure to get plenty of rest and stay well hydrated.  Avoid dairy products and fried foods while feeling nauseous and having diarrhea as these foods can make symptoms worse.  Take pain and nausea medication as prescribed (if any).  Follow up with primary in 2-3 days if symptoms not improving.  Please go to the emergency department if you experience NEW or worsening symptoms including unable to keep down fluids or severe abdominal pain.      ED Prescriptions    Medication Sig Dispense Auth. Provider   ondansetron (ZOFRAN) 4 MG tablet Take 1 tablet (4 mg total) by mouth every 6 (six) hours. 12 tablet Lurene Shadow, PA-C     Controlled Substance Prescriptions [place] Controlled Substance Registry  consulted? Not Applicable   Rolla Plate 09/24/18 1451

## 2018-09-28 ENCOUNTER — Ambulatory Visit (INDEPENDENT_AMBULATORY_CARE_PROVIDER_SITE_OTHER): Payer: BLUE CROSS/BLUE SHIELD | Admitting: Clinical

## 2018-09-28 DIAGNOSIS — F331 Major depressive disorder, recurrent, moderate: Secondary | ICD-10-CM | POA: Diagnosis not present

## 2018-10-05 ENCOUNTER — Ambulatory Visit (INDEPENDENT_AMBULATORY_CARE_PROVIDER_SITE_OTHER): Payer: BLUE CROSS/BLUE SHIELD | Admitting: Clinical

## 2018-10-05 DIAGNOSIS — F331 Major depressive disorder, recurrent, moderate: Secondary | ICD-10-CM

## 2018-10-12 ENCOUNTER — Ambulatory Visit: Payer: BLUE CROSS/BLUE SHIELD | Admitting: Clinical

## 2018-10-13 DIAGNOSIS — F900 Attention-deficit hyperactivity disorder, predominantly inattentive type: Secondary | ICD-10-CM | POA: Diagnosis not present

## 2018-10-19 ENCOUNTER — Ambulatory Visit (INDEPENDENT_AMBULATORY_CARE_PROVIDER_SITE_OTHER): Payer: BLUE CROSS/BLUE SHIELD | Admitting: Clinical

## 2018-10-19 DIAGNOSIS — F331 Major depressive disorder, recurrent, moderate: Secondary | ICD-10-CM

## 2018-10-26 ENCOUNTER — Ambulatory Visit (INDEPENDENT_AMBULATORY_CARE_PROVIDER_SITE_OTHER): Payer: BLUE CROSS/BLUE SHIELD | Admitting: Clinical

## 2018-10-26 DIAGNOSIS — F331 Major depressive disorder, recurrent, moderate: Secondary | ICD-10-CM | POA: Diagnosis not present

## 2018-11-02 ENCOUNTER — Ambulatory Visit (INDEPENDENT_AMBULATORY_CARE_PROVIDER_SITE_OTHER): Payer: BLUE CROSS/BLUE SHIELD | Admitting: Clinical

## 2018-11-02 DIAGNOSIS — F331 Major depressive disorder, recurrent, moderate: Secondary | ICD-10-CM

## 2018-11-09 ENCOUNTER — Ambulatory Visit (INDEPENDENT_AMBULATORY_CARE_PROVIDER_SITE_OTHER): Payer: BLUE CROSS/BLUE SHIELD | Admitting: Clinical

## 2018-11-09 DIAGNOSIS — F331 Major depressive disorder, recurrent, moderate: Secondary | ICD-10-CM

## 2018-11-16 ENCOUNTER — Ambulatory Visit: Payer: BLUE CROSS/BLUE SHIELD | Admitting: Clinical

## 2018-11-23 ENCOUNTER — Ambulatory Visit (INDEPENDENT_AMBULATORY_CARE_PROVIDER_SITE_OTHER): Payer: BLUE CROSS/BLUE SHIELD | Admitting: Clinical

## 2018-11-23 DIAGNOSIS — F331 Major depressive disorder, recurrent, moderate: Secondary | ICD-10-CM | POA: Diagnosis not present

## 2018-11-28 ENCOUNTER — Ambulatory Visit (INDEPENDENT_AMBULATORY_CARE_PROVIDER_SITE_OTHER): Payer: BLUE CROSS/BLUE SHIELD | Admitting: Clinical

## 2018-11-28 DIAGNOSIS — F331 Major depressive disorder, recurrent, moderate: Secondary | ICD-10-CM

## 2018-12-14 ENCOUNTER — Ambulatory Visit: Payer: BLUE CROSS/BLUE SHIELD | Admitting: Clinical

## 2018-12-14 DIAGNOSIS — F331 Major depressive disorder, recurrent, moderate: Secondary | ICD-10-CM | POA: Diagnosis not present

## 2018-12-16 DIAGNOSIS — F900 Attention-deficit hyperactivity disorder, predominantly inattentive type: Secondary | ICD-10-CM | POA: Diagnosis not present

## 2018-12-21 ENCOUNTER — Ambulatory Visit (INDEPENDENT_AMBULATORY_CARE_PROVIDER_SITE_OTHER): Payer: BLUE CROSS/BLUE SHIELD | Admitting: Clinical

## 2018-12-21 DIAGNOSIS — F331 Major depressive disorder, recurrent, moderate: Secondary | ICD-10-CM

## 2018-12-28 ENCOUNTER — Ambulatory Visit (INDEPENDENT_AMBULATORY_CARE_PROVIDER_SITE_OTHER): Payer: BLUE CROSS/BLUE SHIELD | Admitting: Clinical

## 2018-12-28 DIAGNOSIS — F331 Major depressive disorder, recurrent, moderate: Secondary | ICD-10-CM

## 2019-01-04 ENCOUNTER — Ambulatory Visit (INDEPENDENT_AMBULATORY_CARE_PROVIDER_SITE_OTHER): Payer: BLUE CROSS/BLUE SHIELD | Admitting: Clinical

## 2019-01-04 DIAGNOSIS — F331 Major depressive disorder, recurrent, moderate: Secondary | ICD-10-CM | POA: Diagnosis not present

## 2019-01-11 ENCOUNTER — Ambulatory Visit (INDEPENDENT_AMBULATORY_CARE_PROVIDER_SITE_OTHER): Payer: BLUE CROSS/BLUE SHIELD | Admitting: Clinical

## 2019-01-11 DIAGNOSIS — F331 Major depressive disorder, recurrent, moderate: Secondary | ICD-10-CM

## 2019-01-18 ENCOUNTER — Ambulatory Visit (INDEPENDENT_AMBULATORY_CARE_PROVIDER_SITE_OTHER): Payer: BLUE CROSS/BLUE SHIELD | Admitting: Clinical

## 2019-01-18 DIAGNOSIS — F331 Major depressive disorder, recurrent, moderate: Secondary | ICD-10-CM | POA: Diagnosis not present

## 2019-01-25 ENCOUNTER — Ambulatory Visit (INDEPENDENT_AMBULATORY_CARE_PROVIDER_SITE_OTHER): Payer: BLUE CROSS/BLUE SHIELD | Admitting: Clinical

## 2019-01-25 DIAGNOSIS — F331 Major depressive disorder, recurrent, moderate: Secondary | ICD-10-CM

## 2019-02-01 ENCOUNTER — Ambulatory Visit (INDEPENDENT_AMBULATORY_CARE_PROVIDER_SITE_OTHER): Payer: BLUE CROSS/BLUE SHIELD | Admitting: Clinical

## 2019-02-01 DIAGNOSIS — F331 Major depressive disorder, recurrent, moderate: Secondary | ICD-10-CM | POA: Diagnosis not present

## 2019-02-08 ENCOUNTER — Ambulatory Visit (INDEPENDENT_AMBULATORY_CARE_PROVIDER_SITE_OTHER): Payer: BLUE CROSS/BLUE SHIELD | Admitting: Clinical

## 2019-02-08 DIAGNOSIS — F331 Major depressive disorder, recurrent, moderate: Secondary | ICD-10-CM

## 2019-02-08 DIAGNOSIS — F900 Attention-deficit hyperactivity disorder, predominantly inattentive type: Secondary | ICD-10-CM | POA: Diagnosis not present

## 2019-02-15 ENCOUNTER — Other Ambulatory Visit: Payer: Self-pay

## 2019-02-15 ENCOUNTER — Ambulatory Visit (INDEPENDENT_AMBULATORY_CARE_PROVIDER_SITE_OTHER): Payer: BLUE CROSS/BLUE SHIELD | Admitting: Clinical

## 2019-02-15 DIAGNOSIS — F331 Major depressive disorder, recurrent, moderate: Secondary | ICD-10-CM | POA: Diagnosis not present

## 2019-02-22 ENCOUNTER — Ambulatory Visit (INDEPENDENT_AMBULATORY_CARE_PROVIDER_SITE_OTHER): Payer: BLUE CROSS/BLUE SHIELD | Admitting: Clinical

## 2019-02-22 DIAGNOSIS — F331 Major depressive disorder, recurrent, moderate: Secondary | ICD-10-CM | POA: Diagnosis not present

## 2019-03-01 ENCOUNTER — Ambulatory Visit (INDEPENDENT_AMBULATORY_CARE_PROVIDER_SITE_OTHER): Payer: BLUE CROSS/BLUE SHIELD | Admitting: Clinical

## 2019-03-01 DIAGNOSIS — F331 Major depressive disorder, recurrent, moderate: Secondary | ICD-10-CM | POA: Diagnosis not present

## 2019-03-08 ENCOUNTER — Ambulatory Visit (INDEPENDENT_AMBULATORY_CARE_PROVIDER_SITE_OTHER): Payer: BLUE CROSS/BLUE SHIELD | Admitting: Clinical

## 2019-03-08 DIAGNOSIS — F331 Major depressive disorder, recurrent, moderate: Secondary | ICD-10-CM | POA: Diagnosis not present

## 2019-03-18 ENCOUNTER — Ambulatory Visit (INDEPENDENT_AMBULATORY_CARE_PROVIDER_SITE_OTHER): Payer: BLUE CROSS/BLUE SHIELD | Admitting: Clinical

## 2019-03-18 DIAGNOSIS — F331 Major depressive disorder, recurrent, moderate: Secondary | ICD-10-CM

## 2019-03-22 ENCOUNTER — Ambulatory Visit: Payer: BLUE CROSS/BLUE SHIELD | Admitting: Clinical

## 2019-03-24 ENCOUNTER — Ambulatory Visit (INDEPENDENT_AMBULATORY_CARE_PROVIDER_SITE_OTHER): Payer: BLUE CROSS/BLUE SHIELD | Admitting: Clinical

## 2019-03-24 DIAGNOSIS — F331 Major depressive disorder, recurrent, moderate: Secondary | ICD-10-CM | POA: Diagnosis not present

## 2019-03-26 ENCOUNTER — Encounter: Payer: Self-pay | Admitting: Family Medicine

## 2019-03-29 ENCOUNTER — Telehealth (INDEPENDENT_AMBULATORY_CARE_PROVIDER_SITE_OTHER): Payer: BLUE CROSS/BLUE SHIELD | Admitting: Family Medicine

## 2019-03-29 ENCOUNTER — Other Ambulatory Visit: Payer: Self-pay

## 2019-03-29 ENCOUNTER — Ambulatory Visit: Payer: BLUE CROSS/BLUE SHIELD | Admitting: Clinical

## 2019-03-29 DIAGNOSIS — R2242 Localized swelling, mass and lump, left lower limb: Secondary | ICD-10-CM

## 2019-03-29 NOTE — Progress Notes (Signed)
.  [facility] Telemedicine Visit  Patient consented to have virtual visit. Method of visit: Telephone  Encounter participants: Patient: Arthur Dodson - located at home Provider: Renold Don - located at office Others (if applicable): n/a   Chief Complaint: Left foot swelling  HPI:  Ports history of left foot swelling that started on Sunday and resolved after about a day.  He was concerned this portended something dangerous.  He has been sitting most of the day working from home and doing this more often than usual.  Starting to get a month he was walking more for exercise.  He has not been walking since last week.  He has had no injury.  No falls.  No overuse.  He did have some sensation of tingling in the foot being asleep when it was swollen.  Describes the swelling as around his left ankle to his midfoot.  No swelling in his toes.  No swelling in calf or rest of his leg.  No pain in the leg.  He has had no redness.  No tenderness to palpation.  The swelling is since complete resolved.  He is called to make sure that nothing else is going on.  He has had no swelling or problems of the right leg.  No history of DVT.  No current shortness of breath.  Otherwise feels very well.  ROS: per HPI  Pertinent PMHx: History of recurrent depression as well as anxiety and obesity.  Exam: Gen:  Patient awake, alert, fully conversant, oriented x 4 Auditory:  Hearing normal Resp:  Good strong voice.  Speaking in full sentences.  No coughing during exam.  No respiratory distress.  No audible wheezing over the phone  Psych:  Linear and coherent thought process.  No flight of ideas.    Assessment/Plan:  1.  Left foot swelling: -Lasted for about 24 hours and then completely resolved. -He has been eating more takeout he tells me since he has been stuck at home with coronavirus self-isolation. -He is also been seated much more frequently as well. -Recommendation was to keep  his feet elevated when he is seated doing work.  Also he should work to be a little bit more active and start walking during the day as well to help with circulation.  Cut back on salt/processed/takeout foods. -No concerns for DVT is on last Friday.  No pain or redness. -If swelling recurs he should come in to be evaluated and possibly have labs done.  Patient reassured.  Time spent on phone with patient: 18 minutes

## 2019-03-31 ENCOUNTER — Ambulatory Visit (INDEPENDENT_AMBULATORY_CARE_PROVIDER_SITE_OTHER): Payer: BLUE CROSS/BLUE SHIELD | Admitting: Clinical

## 2019-03-31 DIAGNOSIS — F331 Major depressive disorder, recurrent, moderate: Secondary | ICD-10-CM

## 2019-04-07 ENCOUNTER — Ambulatory Visit (INDEPENDENT_AMBULATORY_CARE_PROVIDER_SITE_OTHER): Payer: BLUE CROSS/BLUE SHIELD | Admitting: Clinical

## 2019-04-07 DIAGNOSIS — F331 Major depressive disorder, recurrent, moderate: Secondary | ICD-10-CM | POA: Diagnosis not present

## 2019-04-12 ENCOUNTER — Ambulatory Visit: Payer: BLUE CROSS/BLUE SHIELD | Admitting: Clinical

## 2019-04-14 ENCOUNTER — Ambulatory Visit (INDEPENDENT_AMBULATORY_CARE_PROVIDER_SITE_OTHER): Payer: BLUE CROSS/BLUE SHIELD | Admitting: Clinical

## 2019-04-14 DIAGNOSIS — F331 Major depressive disorder, recurrent, moderate: Secondary | ICD-10-CM

## 2019-04-19 ENCOUNTER — Ambulatory Visit: Payer: BLUE CROSS/BLUE SHIELD | Admitting: Clinical

## 2019-04-21 ENCOUNTER — Ambulatory Visit (INDEPENDENT_AMBULATORY_CARE_PROVIDER_SITE_OTHER): Payer: BLUE CROSS/BLUE SHIELD | Admitting: Clinical

## 2019-04-21 DIAGNOSIS — F331 Major depressive disorder, recurrent, moderate: Secondary | ICD-10-CM | POA: Diagnosis not present

## 2019-04-26 ENCOUNTER — Ambulatory Visit: Payer: BLUE CROSS/BLUE SHIELD | Admitting: Clinical

## 2019-04-28 ENCOUNTER — Ambulatory Visit (INDEPENDENT_AMBULATORY_CARE_PROVIDER_SITE_OTHER): Payer: BLUE CROSS/BLUE SHIELD | Admitting: Clinical

## 2019-04-28 DIAGNOSIS — F331 Major depressive disorder, recurrent, moderate: Secondary | ICD-10-CM

## 2019-05-03 ENCOUNTER — Ambulatory Visit: Payer: BLUE CROSS/BLUE SHIELD | Admitting: Clinical

## 2019-05-05 ENCOUNTER — Ambulatory Visit (INDEPENDENT_AMBULATORY_CARE_PROVIDER_SITE_OTHER): Payer: BLUE CROSS/BLUE SHIELD | Admitting: Clinical

## 2019-05-05 DIAGNOSIS — F331 Major depressive disorder, recurrent, moderate: Secondary | ICD-10-CM

## 2019-05-11 DIAGNOSIS — F900 Attention-deficit hyperactivity disorder, predominantly inattentive type: Secondary | ICD-10-CM | POA: Diagnosis not present

## 2019-05-12 ENCOUNTER — Ambulatory Visit (INDEPENDENT_AMBULATORY_CARE_PROVIDER_SITE_OTHER): Payer: BC Managed Care – PPO | Admitting: Clinical

## 2019-05-12 DIAGNOSIS — F331 Major depressive disorder, recurrent, moderate: Secondary | ICD-10-CM | POA: Diagnosis not present

## 2019-05-19 ENCOUNTER — Ambulatory Visit (INDEPENDENT_AMBULATORY_CARE_PROVIDER_SITE_OTHER): Payer: BC Managed Care – PPO | Admitting: Clinical

## 2019-05-19 DIAGNOSIS — F331 Major depressive disorder, recurrent, moderate: Secondary | ICD-10-CM

## 2019-05-26 ENCOUNTER — Ambulatory Visit (INDEPENDENT_AMBULATORY_CARE_PROVIDER_SITE_OTHER): Payer: BC Managed Care – PPO | Admitting: Clinical

## 2019-05-26 DIAGNOSIS — F331 Major depressive disorder, recurrent, moderate: Secondary | ICD-10-CM | POA: Diagnosis not present

## 2019-05-28 ENCOUNTER — Ambulatory Visit (INDEPENDENT_AMBULATORY_CARE_PROVIDER_SITE_OTHER): Payer: BC Managed Care – PPO | Admitting: Clinical

## 2019-05-28 DIAGNOSIS — F331 Major depressive disorder, recurrent, moderate: Secondary | ICD-10-CM | POA: Diagnosis not present

## 2019-06-02 ENCOUNTER — Ambulatory Visit (INDEPENDENT_AMBULATORY_CARE_PROVIDER_SITE_OTHER): Payer: BC Managed Care – PPO | Admitting: Clinical

## 2019-06-02 DIAGNOSIS — F331 Major depressive disorder, recurrent, moderate: Secondary | ICD-10-CM

## 2019-06-09 ENCOUNTER — Ambulatory Visit (INDEPENDENT_AMBULATORY_CARE_PROVIDER_SITE_OTHER): Payer: BC Managed Care – PPO | Admitting: Clinical

## 2019-06-09 DIAGNOSIS — F331 Major depressive disorder, recurrent, moderate: Secondary | ICD-10-CM

## 2019-06-16 ENCOUNTER — Ambulatory Visit (INDEPENDENT_AMBULATORY_CARE_PROVIDER_SITE_OTHER): Payer: BC Managed Care – PPO | Admitting: Clinical

## 2019-06-16 DIAGNOSIS — F331 Major depressive disorder, recurrent, moderate: Secondary | ICD-10-CM

## 2019-06-23 ENCOUNTER — Ambulatory Visit (INDEPENDENT_AMBULATORY_CARE_PROVIDER_SITE_OTHER): Payer: BC Managed Care – PPO | Admitting: Clinical

## 2019-06-23 DIAGNOSIS — F331 Major depressive disorder, recurrent, moderate: Secondary | ICD-10-CM | POA: Diagnosis not present

## 2019-06-30 ENCOUNTER — Ambulatory Visit (INDEPENDENT_AMBULATORY_CARE_PROVIDER_SITE_OTHER): Payer: BC Managed Care – PPO | Admitting: Clinical

## 2019-06-30 DIAGNOSIS — F331 Major depressive disorder, recurrent, moderate: Secondary | ICD-10-CM | POA: Diagnosis not present

## 2019-07-07 ENCOUNTER — Ambulatory Visit (INDEPENDENT_AMBULATORY_CARE_PROVIDER_SITE_OTHER): Payer: BC Managed Care – PPO | Admitting: Clinical

## 2019-07-07 DIAGNOSIS — F331 Major depressive disorder, recurrent, moderate: Secondary | ICD-10-CM | POA: Diagnosis not present

## 2019-07-14 ENCOUNTER — Ambulatory Visit (INDEPENDENT_AMBULATORY_CARE_PROVIDER_SITE_OTHER): Payer: BC Managed Care – PPO | Admitting: Clinical

## 2019-07-14 DIAGNOSIS — F331 Major depressive disorder, recurrent, moderate: Secondary | ICD-10-CM

## 2019-07-21 ENCOUNTER — Ambulatory Visit (INDEPENDENT_AMBULATORY_CARE_PROVIDER_SITE_OTHER): Payer: BC Managed Care – PPO | Admitting: Clinical

## 2019-07-21 DIAGNOSIS — F331 Major depressive disorder, recurrent, moderate: Secondary | ICD-10-CM | POA: Diagnosis not present

## 2019-08-04 ENCOUNTER — Ambulatory Visit (INDEPENDENT_AMBULATORY_CARE_PROVIDER_SITE_OTHER): Payer: BC Managed Care – PPO | Admitting: Clinical

## 2019-08-04 DIAGNOSIS — F331 Major depressive disorder, recurrent, moderate: Secondary | ICD-10-CM | POA: Diagnosis not present

## 2019-08-08 DIAGNOSIS — F900 Attention-deficit hyperactivity disorder, predominantly inattentive type: Secondary | ICD-10-CM | POA: Diagnosis not present

## 2019-08-11 ENCOUNTER — Ambulatory Visit (INDEPENDENT_AMBULATORY_CARE_PROVIDER_SITE_OTHER): Payer: BC Managed Care – PPO | Admitting: Clinical

## 2019-08-11 DIAGNOSIS — F331 Major depressive disorder, recurrent, moderate: Secondary | ICD-10-CM | POA: Diagnosis not present

## 2019-08-18 ENCOUNTER — Ambulatory Visit (INDEPENDENT_AMBULATORY_CARE_PROVIDER_SITE_OTHER): Payer: BC Managed Care – PPO | Admitting: Clinical

## 2019-08-18 DIAGNOSIS — F322 Major depressive disorder, single episode, severe without psychotic features: Secondary | ICD-10-CM

## 2019-08-25 ENCOUNTER — Ambulatory Visit: Payer: BC Managed Care – PPO | Admitting: Clinical

## 2019-08-27 ENCOUNTER — Ambulatory Visit (INDEPENDENT_AMBULATORY_CARE_PROVIDER_SITE_OTHER): Payer: BC Managed Care – PPO | Admitting: Clinical

## 2019-08-27 DIAGNOSIS — F322 Major depressive disorder, single episode, severe without psychotic features: Secondary | ICD-10-CM

## 2019-09-04 ENCOUNTER — Ambulatory Visit (INDEPENDENT_AMBULATORY_CARE_PROVIDER_SITE_OTHER): Payer: BC Managed Care – PPO | Admitting: Clinical

## 2019-09-04 DIAGNOSIS — F331 Major depressive disorder, recurrent, moderate: Secondary | ICD-10-CM

## 2019-09-06 ENCOUNTER — Ambulatory Visit: Payer: BC Managed Care – PPO | Admitting: Clinical

## 2019-09-07 DIAGNOSIS — F900 Attention-deficit hyperactivity disorder, predominantly inattentive type: Secondary | ICD-10-CM | POA: Diagnosis not present

## 2019-09-13 ENCOUNTER — Ambulatory Visit: Payer: BC Managed Care – PPO | Admitting: Clinical

## 2019-09-18 ENCOUNTER — Ambulatory Visit (INDEPENDENT_AMBULATORY_CARE_PROVIDER_SITE_OTHER): Payer: BC Managed Care – PPO | Admitting: Clinical

## 2019-09-18 DIAGNOSIS — F331 Major depressive disorder, recurrent, moderate: Secondary | ICD-10-CM

## 2019-09-20 ENCOUNTER — Ambulatory Visit: Payer: BC Managed Care – PPO | Admitting: Clinical

## 2019-09-27 ENCOUNTER — Ambulatory Visit: Payer: BC Managed Care – PPO | Admitting: Clinical

## 2019-10-02 ENCOUNTER — Ambulatory Visit: Payer: BC Managed Care – PPO | Admitting: Clinical

## 2019-10-04 ENCOUNTER — Ambulatory Visit: Payer: BC Managed Care – PPO | Admitting: Clinical

## 2019-10-06 ENCOUNTER — Ambulatory Visit (INDEPENDENT_AMBULATORY_CARE_PROVIDER_SITE_OTHER): Payer: BC Managed Care – PPO | Admitting: Clinical

## 2019-10-06 DIAGNOSIS — F331 Major depressive disorder, recurrent, moderate: Secondary | ICD-10-CM

## 2019-10-11 ENCOUNTER — Ambulatory Visit: Payer: BC Managed Care – PPO | Admitting: Clinical

## 2019-10-16 ENCOUNTER — Ambulatory Visit (INDEPENDENT_AMBULATORY_CARE_PROVIDER_SITE_OTHER): Payer: BC Managed Care – PPO | Admitting: Clinical

## 2019-10-16 DIAGNOSIS — F331 Major depressive disorder, recurrent, moderate: Secondary | ICD-10-CM

## 2019-10-18 ENCOUNTER — Ambulatory Visit: Payer: BC Managed Care – PPO | Admitting: Clinical

## 2019-10-25 ENCOUNTER — Ambulatory Visit: Payer: BC Managed Care – PPO | Admitting: Clinical

## 2019-10-30 ENCOUNTER — Ambulatory Visit (INDEPENDENT_AMBULATORY_CARE_PROVIDER_SITE_OTHER): Payer: BC Managed Care – PPO | Admitting: Clinical

## 2019-10-30 DIAGNOSIS — F331 Major depressive disorder, recurrent, moderate: Secondary | ICD-10-CM | POA: Diagnosis not present

## 2019-10-30 DIAGNOSIS — F900 Attention-deficit hyperactivity disorder, predominantly inattentive type: Secondary | ICD-10-CM | POA: Diagnosis not present

## 2019-11-01 ENCOUNTER — Ambulatory Visit: Payer: BC Managed Care – PPO | Admitting: Clinical

## 2019-11-08 ENCOUNTER — Ambulatory Visit: Payer: BC Managed Care – PPO | Admitting: Clinical

## 2019-11-13 ENCOUNTER — Ambulatory Visit: Payer: BC Managed Care – PPO | Admitting: Clinical

## 2019-11-15 ENCOUNTER — Ambulatory Visit: Payer: BC Managed Care – PPO | Admitting: Clinical

## 2019-11-18 ENCOUNTER — Ambulatory Visit (INDEPENDENT_AMBULATORY_CARE_PROVIDER_SITE_OTHER): Payer: BC Managed Care – PPO | Admitting: Clinical

## 2019-11-18 DIAGNOSIS — F331 Major depressive disorder, recurrent, moderate: Secondary | ICD-10-CM | POA: Diagnosis not present

## 2019-11-22 ENCOUNTER — Ambulatory Visit: Payer: BC Managed Care – PPO | Admitting: Clinical

## 2019-11-27 ENCOUNTER — Ambulatory Visit (INDEPENDENT_AMBULATORY_CARE_PROVIDER_SITE_OTHER): Payer: BC Managed Care – PPO | Admitting: Clinical

## 2019-11-27 DIAGNOSIS — F331 Major depressive disorder, recurrent, moderate: Secondary | ICD-10-CM

## 2019-11-29 ENCOUNTER — Ambulatory Visit: Payer: BC Managed Care – PPO | Admitting: Clinical

## 2019-12-06 ENCOUNTER — Ambulatory Visit: Payer: BC Managed Care – PPO | Admitting: Clinical

## 2019-12-11 ENCOUNTER — Ambulatory Visit (INDEPENDENT_AMBULATORY_CARE_PROVIDER_SITE_OTHER): Payer: 59 | Admitting: Clinical

## 2019-12-11 DIAGNOSIS — R69 Illness, unspecified: Secondary | ICD-10-CM | POA: Diagnosis not present

## 2019-12-11 DIAGNOSIS — F331 Major depressive disorder, recurrent, moderate: Secondary | ICD-10-CM | POA: Diagnosis not present

## 2019-12-14 ENCOUNTER — Encounter: Payer: Self-pay | Admitting: Family Medicine

## 2019-12-14 ENCOUNTER — Ambulatory Visit (INDEPENDENT_AMBULATORY_CARE_PROVIDER_SITE_OTHER)
Admission: RE | Admit: 2019-12-14 | Discharge: 2019-12-14 | Disposition: A | Discharge status: Home / Self Care | Payer: Self-pay | Source: Ambulatory Visit

## 2019-12-14 DIAGNOSIS — R110 Nausea: Secondary | ICD-10-CM

## 2019-12-14 DIAGNOSIS — G8929 Other chronic pain: Secondary | ICD-10-CM

## 2019-12-14 DIAGNOSIS — R519 Headache, unspecified: Secondary | ICD-10-CM

## 2019-12-14 MED ORDER — ONDANSETRON HCL 4 MG PO TABS: 4.0000 mg | ORAL_TABLET | Freq: Four times a day (QID) | ORAL | 0 refills | Status: AC | PRN

## 2019-12-14 MED ORDER — IBUPROFEN 800 MG PO TABS: 800.0000 mg | ORAL_TABLET | Freq: Three times a day (TID) | ORAL | 0 refills | Status: AC | PRN

## 2019-12-14 NOTE — ED Provider Notes (Signed)
Virtual Visit via Video Note:  Arthur Dodson  initiated request for Telemedicine visit with [facility] team. I connected with Arthur Dodson  on 12/14/2019 at 11:15 AM  for a synchronized telemedicine visit using a video enabled HIPPA compliant telemedicine application. I verified that I am speaking with Arthur Dodson  using two identifiers. Mickie Bail, NP  was physically located in a [facility] Urgent care site and Arthur Dodson was located at a different location.   The limitations of evaluation and management by telemedicine as well as the availability of in-person appointments were discussed. Patient was informed that he  may incur a bill ( including co-pay) for this virtual visit encounter. Arthur Dodson  expressed understanding and gave verbal consent to proceed with virtual visit.     History of Present Illness:Arthur Dodson  is a 29 y.o. male presents for evaluation of chronic headaches, fatigue, and post nasal drip x several months.  Treatment attempted at home with ibuprofen, increased water intake, Sudafed; symptoms improved with ibuprofen.  Patient also reports difficulty sleeping; waking up early daily.  Patient reports his Adderall also causes him to have headache so he hasn't taken it in 3 weeks but plans to restart this medication.  He denies fever, chills, vomiting, diarrhea, constipation, abdominal pain, dizziness, weakness, chest pain, shortness of breath, or other symptoms.    No Known Allergies   Past Medical History:  Diagnosis Date  . Anxiety   . Cholesteatoma   . Depression      Social History   Tobacco Use  . Smoking status: Never Smoker  . Smokeless tobacco: Never Used  Substance Use Topics  . Alcohol use: No  . Drug use: No        Observations/Objective: Physical Exam  VITALS: Patient denies fever. GENERAL: Alert, appears well and in no acute distress. HEENT: Atraumatic. NECK: Normal movements of the head and  neck. CARDIOPULMONARY: No increased WOB. Speaking in clear sentences. I:E ratio WNL.  MS: Moves all visible extremities without noticeable abnormality. PSYCH: Pleasant and cooperative, well-groomed. Speech normal rate and rhythm. Affect is appropriate. Insight and judgement are appropriate. Attention is focused, linear, and appropriate.  NEURO: CN grossly intact. Oriented as arrived to appointment on time with no prompting. Moves both UE equally.  SKIN: No obvious lesions, wounds, erythema, or cyanosis noted on face or hands.   Assessment and Plan:    ICD-10-CM   1. Chronic nonintractable headache, unspecified headache type  R51.9    G89.29   2. Nausea without vomiting  R11.0        Follow Up Instructions: Treating nausea with Zofran.  Treating headache with ibuprofen.  Instructed patient to follow-up with his primary care provider in the next 2 to 3 days to discuss his ongoing chronic symptoms.  Patient agrees to plan of care.      I discussed the assessment and treatment plan with the patient. The patient was provided an opportunity to ask questions and all were answered. The patient agreed with the plan and demonstrated an understanding of the instructions.   The patient was advised to call back or seek an in-person evaluation if the symptoms worsen or if the condition fails to improve as anticipated.      Mickie Bail, NP  12/14/2019 11:15 AM         Mickie Bail, NP 12/14/19 1115

## 2019-12-14 NOTE — Discharge Instructions (Signed)
Take the ibuprofen and Zofran as directed.    Follow-up with your primary care provider in 2 to 3 days to discuss your symptoms.

## 2019-12-18 ENCOUNTER — Ambulatory Visit (INDEPENDENT_AMBULATORY_CARE_PROVIDER_SITE_OTHER): Payer: Self-pay | Admitting: Family Medicine

## 2019-12-18 ENCOUNTER — Encounter: Payer: Self-pay | Admitting: Family Medicine

## 2019-12-18 ENCOUNTER — Other Ambulatory Visit: Payer: Self-pay

## 2019-12-18 VITALS — BP 110/80 | HR 87 | Temp 98.2°F | Wt 218.0 lb

## 2019-12-18 DIAGNOSIS — G43009 Migraine without aura, not intractable, without status migrainosus: Secondary | ICD-10-CM

## 2019-12-18 DIAGNOSIS — G4700 Insomnia, unspecified: Secondary | ICD-10-CM

## 2019-12-18 DIAGNOSIS — H9191 Unspecified hearing loss, right ear: Secondary | ICD-10-CM

## 2019-12-18 NOTE — Patient Instructions (Signed)
Thank you for coming to see me today. It was a pleasure. Today we talked about:   Your headaches:  Continue to take ibuprofen 800mg  as needed.  Take 8 hrs apart and not more than twice in one day.  If it is not helping your headaches please let me know.  Use nasal saline spray twice a day to help with your sinuses.  Call Dr. office and see if you can be seen sooner.  Please follow-up with me in 1-2 weeks or sooner as needed.  If you have any questions or concerns, please do not hesitate to call the office at [phone].  Best,   [phone], DO

## 2019-12-18 NOTE — Assessment & Plan Note (Signed)
Bilateral ear exam was within normal limits.  No obvious cholesteatoma seen on exam.  Has seen ENT in the past.  He is very concerned about this sensation, will refer back to ENT.  He is requesting a different ear nose and throat specialist that he had seen previously, therefore referral put in stating this.

## 2019-12-18 NOTE — Assessment & Plan Note (Signed)
Patient has used melatonin in the past.  Is also used trazodone.  He follows with a psychiatrist and I suspect that his underlying anxiety is not well controlled and is causing symptoms of insomnia as well as could be contributing to his migraine headaches.  Advised patient to call and see if he can be seen by Dr. Rene Kocher sooner than his scheduled appointment in the next month or 2.  Also advised to continue to speak with behavioral health specialist.  Explained to patient that it would likely take some time to improve his insomnia.

## 2019-12-18 NOTE — Assessment & Plan Note (Addendum)
Neuro exam reassuring.  Symptoms consistent with migraine given nausea and photophobia/phonophobia.  Could have component of sinus inflammation as well given right ear complaint and overall pressure sensation.  Also discussed that stress and anxiety can be triggers for migraines, therefore this may be contributing.  Discussed that given he has been doing well with ibuprofen and has only taken this a few times, can continue with this.  In the future, could consider Imitrex or propranolol for further treatment of headaches.  Would like for patient to start with over-the-counter ibuprofen to see if he has improvement with this.  If symptoms worsen or change, patient to follow-up as soon as possible.  Advised to follow-up in 1 to 2 weeks.  Patient asked if this would keep him from working, advised that neurologically speaking, he has no reason to not attend work, but if he is not feeling well, he can make that decision.

## 2019-12-18 NOTE — Progress Notes (Signed)
Subjective: Chief Complaint  Patient presents with  . Insomnia  . Headache     HPI: Arthur Dodson is a 29 y.o. presenting to clinic today to discuss the following:  Headache Went to urgent care last week.  Mostly felt like heaviness on the head, felt like a band on the front of his head or the top of his head.  Determined by how much he slept last night.  "Feels like headphones were on my head."  Also has sinus issues and drainage.  Had associated nausea.  Just wanted to lay down and sleep.  Sometimes this would make it better, sometimes it wouldn't.  Degrees of intensity have fluctuated.  Taking Adderall 30mg  XR, saw that this could be a side effect.  Hasn't taken in the last 2 months, and hasn't had a chance to pick it up.  Had tried sudafed, caffiene.  Hard to know when the headaches started, thinks he had this general problem around September.  No vision changes.  Phonophobia.  Unsure if photophobia but states "it usually feels better to be in a dark room."  Has recently taken ibuprofen and sudafed.  Had some improvement with ibuprofen.  Took Ibuprofen 800 mg two days last week and it helped.    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  Feels like it is clogged with fluid, but no drainage from the ear.  If rubs his ear, it clears up.  "It sounds like a blown speaker."  Has not tried any nasal sprays recently.  Last saw ENT about 2 years ago after surgery.  Does not want to see the same doctor he saw previously.  Insomnia Has been having difficulty sleeping in the last 6 months or longer.  Got off a sleep schedule at the beginning of quarantine.  Was sleeping 10pm to 4am, still waking up at 3-4am no matter when he wakes up.  No trouble falling asleep, unless anxiety level is high or poor sleep hygiene.  Mostly just problems waking up.  Has been on Prozac in the past, but no longer.  Has been given trazodone previously, but hard to remember if it  helped.  Has tried melatonin, but no improvement with this.  Follows with Dr. Daron Offer, has an appointment coming up in Feb or March.  Doing every 2 week counseling at [facility] in [facility], had to increase frequency recently.  No SI.      ROS noted in HPI. Chief complaint noted.  Other Pertinent PMH: Generalized anxiety disorder Past Medical, Surgical, Social, and Family History Reviewed & Updated per EMR.      Social History   Tobacco Use  Smoking Status Never Smoker  Smokeless Tobacco Never Used   Smoking status noted.    Objective: BP 110/80   Pulse 87   Temp 98.2 F (36.8 C) (Oral)   Wt 218 lb (98.9 kg)   SpO2 99%   BMI 33.15 kg/m  Vitals and nursing notes reviewed  Physical Exam:  General: 29 y.o. male in NAD HEENT:  NCAT, PERRL, EOMI, b/l ear canals without abnormality, clear TMs Lungs: breathing comfortably on RA Skin: warm and dry Extremities: No edema, 5/5 strength BUE/BLE Neuro: CN II - XII grossly intact, sensation intact throughout   No results found for this or any previous visit (from the past 72 hour(s)).  Assessment/Plan:  Migraine without aura and without status migrainosus, not intractable Neuro exam reassuring.  Symptoms  consistent with migraine given nausea and photophobia/phonophobia.  Could have component of sinus inflammation as well given right ear complaint and overall pressure sensation.  Also discussed that stress and anxiety can be triggers for migraines, therefore this may be contributing.  Discussed that given he has been doing well with ibuprofen and has only taken this a few times, can continue with this.  In the future, could consider Imitrex or propranolol for further treatment of headaches.  Would like for patient to start with over-the-counter ibuprofen to see if he has improvement with this.  If symptoms worsen or change, patient to follow-up as soon as possible.  Advised to follow-up in 1 to 2 weeks.  Patient asked if this would keep him from  working, advised that neurologically speaking, he has no reason to not attend work, but if he is not feeling well, he can make that decision.  Hearing decreased, right Bilateral ear exam was within normal limits.  No obvious cholesteatoma seen on exam.  Has seen ENT in the past.  He is very concerned about this sensation, will refer back to ENT.  He is requesting a different ear nose and throat specialist that he had seen previously, therefore referral put in stating this.  Insomnia Patient has used melatonin in the past.  Is also used trazodone.  He follows with a psychiatrist and I suspect that his underlying anxiety is not well controlled and is causing symptoms of insomnia as well as could be contributing to his migraine headaches.  Advised patient to call and see if he can be seen by Dr. Rene Kocher sooner than his scheduled appointment in the next month or 2.  Also advised to continue to speak with behavioral health specialist.  Explained to patient that it would likely take some time to improve his insomnia.     PATIENT EDUCATION PROVIDED: See AVS    Diagnosis and plan along with any newly prescribed medication(s) were discussed in detail with this patient today. The patient verbalized understanding and agreed with the plan. Patient advised if symptoms worsen return to clinic or ER.      Orders Placed This Encounter  Procedures  . Ambulatory referral to ENT    Referral Priority:   Routine    Referral Type:   Consultation    Referral Reason:   Specialty Services Required    Requested Specialty:   Otolaryngology    Number of Visits Requested:   1    No orders of the defined types were placed in this encounter.    Luis Abed, DO 12/18/2019, 12:16 PM PGY-2 [facility]

## 2019-12-20 ENCOUNTER — Other Ambulatory Visit: Payer: Self-pay | Admitting: Family Medicine

## 2019-12-20 ENCOUNTER — Encounter: Payer: Self-pay | Admitting: Family Medicine

## 2019-12-20 NOTE — Progress Notes (Signed)
Created in error

## 2019-12-25 ENCOUNTER — Ambulatory Visit (INDEPENDENT_AMBULATORY_CARE_PROVIDER_SITE_OTHER): Payer: 59 | Admitting: Clinical

## 2019-12-25 DIAGNOSIS — F331 Major depressive disorder, recurrent, moderate: Secondary | ICD-10-CM

## 2019-12-25 DIAGNOSIS — R69 Illness, unspecified: Secondary | ICD-10-CM | POA: Diagnosis not present

## 2019-12-28 ENCOUNTER — Encounter: Payer: Self-pay | Admitting: Family Medicine

## 2019-12-28 DIAGNOSIS — R69 Illness, unspecified: Secondary | ICD-10-CM | POA: Diagnosis not present

## 2019-12-28 DIAGNOSIS — F3341 Major depressive disorder, recurrent, in partial remission: Secondary | ICD-10-CM | POA: Diagnosis not present

## 2019-12-29 ENCOUNTER — Other Ambulatory Visit: Payer: Self-pay | Admitting: Family Medicine

## 2019-12-29 DIAGNOSIS — G4700 Insomnia, unspecified: Secondary | ICD-10-CM

## 2019-12-29 DIAGNOSIS — G43009 Migraine without aura, not intractable, without status migrainosus: Secondary | ICD-10-CM

## 2019-12-29 NOTE — Progress Notes (Signed)
Concern for OSA given headaches and difficulty sleeping.  Patient requesting sleep study, order placed.

## 2020-01-03 DIAGNOSIS — Z8669 Personal history of other diseases of the nervous system and sense organs: Secondary | ICD-10-CM | POA: Diagnosis not present

## 2020-01-03 DIAGNOSIS — H9201 Otalgia, right ear: Secondary | ICD-10-CM | POA: Diagnosis not present

## 2020-01-03 DIAGNOSIS — H90A31 Mixed conductive and sensorineural hearing loss, unilateral, right ear with restricted hearing on the contralateral side: Secondary | ICD-10-CM | POA: Diagnosis not present

## 2020-01-03 DIAGNOSIS — H903 Sensorineural hearing loss, bilateral: Secondary | ICD-10-CM | POA: Diagnosis not present

## 2020-01-03 DIAGNOSIS — R519 Headache, unspecified: Secondary | ICD-10-CM | POA: Diagnosis not present

## 2020-01-03 DIAGNOSIS — Z7289 Other problems related to lifestyle: Secondary | ICD-10-CM | POA: Diagnosis not present

## 2020-01-03 DIAGNOSIS — H9192 Unspecified hearing loss, left ear: Secondary | ICD-10-CM | POA: Diagnosis not present

## 2020-01-08 ENCOUNTER — Ambulatory Visit (INDEPENDENT_AMBULATORY_CARE_PROVIDER_SITE_OTHER): Payer: 59 | Admitting: Clinical

## 2020-01-08 DIAGNOSIS — F331 Major depressive disorder, recurrent, moderate: Secondary | ICD-10-CM | POA: Diagnosis not present

## 2020-01-08 DIAGNOSIS — R69 Illness, unspecified: Secondary | ICD-10-CM | POA: Diagnosis not present

## 2020-01-09 ENCOUNTER — Other Ambulatory Visit: Payer: Self-pay | Admitting: Physician Assistant

## 2020-01-09 DIAGNOSIS — H905 Unspecified sensorineural hearing loss: Secondary | ICD-10-CM

## 2020-01-09 DIAGNOSIS — H903 Sensorineural hearing loss, bilateral: Secondary | ICD-10-CM

## 2020-01-09 DIAGNOSIS — R519 Headache, unspecified: Secondary | ICD-10-CM

## 2020-01-18 ENCOUNTER — Ambulatory Visit: Payer: 59 | Attending: Internal Medicine

## 2020-01-18 DIAGNOSIS — Z23 Encounter for immunization: Secondary | ICD-10-CM

## 2020-01-18 NOTE — Progress Notes (Signed)
   Covid-19 Vaccination Clinic  Name:  Cassell Voorhies    MRN: [id number] DOB: [birth year]  01/18/2020  Mr. Sutch was observed post Covid-19 immunization for 15 minutes without incidence. He was provided with Vaccine Information Sheet and instruction to access the V-Safe system.   Mr. Wortley was instructed to call 911 with any severe reactions post vaccine: Marland Kitchen Difficulty breathing  . Swelling of your face and throat  . A fast heartbeat  . A bad rash all over your body  . Dizziness and weakness    Immunizations Administered    Name Date Dose VIS Date Route   Pfizer COVID-19 Vaccine 01/18/2020 11:58 AM 0.3 mL 11/17/2019 Intramuscular   Manufacturer: [employer], [employer]   Lot: [id number]   NDC: [address]-9

## 2020-01-22 ENCOUNTER — Ambulatory Visit (INDEPENDENT_AMBULATORY_CARE_PROVIDER_SITE_OTHER): Payer: 59 | Admitting: Clinical

## 2020-01-22 DIAGNOSIS — F331 Major depressive disorder, recurrent, moderate: Secondary | ICD-10-CM | POA: Diagnosis not present

## 2020-01-22 DIAGNOSIS — R69 Illness, unspecified: Secondary | ICD-10-CM | POA: Diagnosis not present

## 2020-01-27 DIAGNOSIS — R69 Illness, unspecified: Secondary | ICD-10-CM | POA: Diagnosis not present

## 2020-01-27 DIAGNOSIS — F3341 Major depressive disorder, recurrent, in partial remission: Secondary | ICD-10-CM | POA: Diagnosis not present

## 2020-01-31 ENCOUNTER — Ambulatory Visit (INDEPENDENT_AMBULATORY_CARE_PROVIDER_SITE_OTHER): Payer: 59 | Admitting: Clinical

## 2020-01-31 DIAGNOSIS — F322 Major depressive disorder, single episode, severe without psychotic features: Secondary | ICD-10-CM

## 2020-01-31 DIAGNOSIS — R69 Illness, unspecified: Secondary | ICD-10-CM | POA: Diagnosis not present

## 2020-02-05 ENCOUNTER — Ambulatory Visit (INDEPENDENT_AMBULATORY_CARE_PROVIDER_SITE_OTHER): Payer: 59 | Admitting: Clinical

## 2020-02-05 DIAGNOSIS — F331 Major depressive disorder, recurrent, moderate: Secondary | ICD-10-CM | POA: Diagnosis not present

## 2020-02-05 DIAGNOSIS — R69 Illness, unspecified: Secondary | ICD-10-CM | POA: Diagnosis not present

## 2020-02-10 ENCOUNTER — Ambulatory Visit: Payer: 59 | Attending: Internal Medicine

## 2020-02-10 DIAGNOSIS — Z23 Encounter for immunization: Secondary | ICD-10-CM | POA: Insufficient documentation

## 2020-02-10 NOTE — Progress Notes (Signed)
   Covid-19 Vaccination Clinic  Name:  Arthur Dodson    MRN: [id number] DOB: 06/27/1991  02/10/2020  [name] was observed post Covid-19 immunization for 15 minutes without incident. He was provided with Vaccine Information Sheet and instruction to access the V-Safe system.   [name] was instructed to call 911 with any severe reactions post vaccine: Marland Kitchen Difficulty breathing  . Swelling of face and throat  . A fast heartbeat  . A bad rash all over body  . Dizziness and weakness   Immunizations Administered    Name Date Dose VIS Date Route   Pfizer COVID-19 Vaccine 02/10/2020 11:13 AM 0.3 mL 11/17/2019 Intramuscular   Manufacturer: [employer], [employer]   Lot: [id number]   NDC: [address]-6

## 2020-02-13 ENCOUNTER — Other Ambulatory Visit: Payer: 59

## 2020-02-19 ENCOUNTER — Ambulatory Visit (INDEPENDENT_AMBULATORY_CARE_PROVIDER_SITE_OTHER): Payer: 59 | Admitting: Clinical

## 2020-02-19 DIAGNOSIS — R69 Illness, unspecified: Secondary | ICD-10-CM | POA: Diagnosis not present

## 2020-02-19 DIAGNOSIS — F322 Major depressive disorder, single episode, severe without psychotic features: Secondary | ICD-10-CM

## 2020-02-26 DIAGNOSIS — R69 Illness, unspecified: Secondary | ICD-10-CM | POA: Diagnosis not present

## 2020-03-04 ENCOUNTER — Ambulatory Visit: Payer: BC Managed Care – PPO | Admitting: Clinical

## 2020-03-15 DIAGNOSIS — E663 Overweight: Secondary | ICD-10-CM | POA: Diagnosis not present

## 2020-03-15 DIAGNOSIS — Z79890 Hormone replacement therapy (postmenopausal): Secondary | ICD-10-CM | POA: Diagnosis not present

## 2020-03-15 DIAGNOSIS — R69 Illness, unspecified: Secondary | ICD-10-CM | POA: Diagnosis not present

## 2020-03-15 DIAGNOSIS — Z79899 Other long term (current) drug therapy: Secondary | ICD-10-CM | POA: Diagnosis not present

## 2020-03-15 DIAGNOSIS — L649 Androgenic alopecia, unspecified: Secondary | ICD-10-CM | POA: Diagnosis not present

## 2020-03-15 DIAGNOSIS — F641 Gender identity disorder in adolescence and adulthood: Secondary | ICD-10-CM | POA: Diagnosis not present

## 2020-03-18 ENCOUNTER — Ambulatory Visit: Payer: BC Managed Care – PPO | Admitting: Clinical

## 2020-03-23 DIAGNOSIS — F649 Gender identity disorder, unspecified: Secondary | ICD-10-CM | POA: Diagnosis not present

## 2020-03-23 DIAGNOSIS — R69 Illness, unspecified: Secondary | ICD-10-CM | POA: Diagnosis not present

## 2020-03-23 DIAGNOSIS — F3341 Major depressive disorder, recurrent, in partial remission: Secondary | ICD-10-CM | POA: Diagnosis not present

## 2020-04-01 ENCOUNTER — Ambulatory Visit: Payer: BC Managed Care – PPO | Admitting: Clinical

## 2020-04-15 ENCOUNTER — Ambulatory Visit: Payer: BC Managed Care – PPO | Admitting: Clinical

## 2020-04-16 DIAGNOSIS — R69 Illness, unspecified: Secondary | ICD-10-CM | POA: Diagnosis not present

## 2020-04-18 DIAGNOSIS — E663 Overweight: Secondary | ICD-10-CM | POA: Diagnosis not present

## 2020-04-18 DIAGNOSIS — F900 Attention-deficit hyperactivity disorder, predominantly inattentive type: Secondary | ICD-10-CM | POA: Diagnosis not present

## 2020-04-18 DIAGNOSIS — Z79818 Long term (current) use of other agents affecting estrogen receptors and estrogen levels: Secondary | ICD-10-CM | POA: Diagnosis not present

## 2020-04-18 DIAGNOSIS — R69 Illness, unspecified: Secondary | ICD-10-CM | POA: Diagnosis not present

## 2020-04-18 DIAGNOSIS — Z79899 Other long term (current) drug therapy: Secondary | ICD-10-CM | POA: Diagnosis not present

## 2020-04-18 DIAGNOSIS — Z79890 Hormone replacement therapy (postmenopausal): Secondary | ICD-10-CM | POA: Diagnosis not present

## 2020-04-18 DIAGNOSIS — L649 Androgenic alopecia, unspecified: Secondary | ICD-10-CM | POA: Diagnosis not present

## 2020-04-19 DIAGNOSIS — F330 Major depressive disorder, recurrent, mild: Secondary | ICD-10-CM | POA: Diagnosis not present

## 2020-04-19 DIAGNOSIS — L649 Androgenic alopecia, unspecified: Secondary | ICD-10-CM | POA: Diagnosis not present

## 2020-04-19 DIAGNOSIS — R69 Illness, unspecified: Secondary | ICD-10-CM | POA: Diagnosis not present

## 2020-04-19 DIAGNOSIS — F900 Attention-deficit hyperactivity disorder, predominantly inattentive type: Secondary | ICD-10-CM | POA: Diagnosis not present

## 2020-04-19 DIAGNOSIS — F419 Anxiety disorder, unspecified: Secondary | ICD-10-CM | POA: Diagnosis not present

## 2020-04-19 DIAGNOSIS — Z79899 Other long term (current) drug therapy: Secondary | ICD-10-CM | POA: Diagnosis not present

## 2020-04-19 DIAGNOSIS — Z79818 Long term (current) use of other agents affecting estrogen receptors and estrogen levels: Secondary | ICD-10-CM | POA: Diagnosis not present

## 2020-04-29 ENCOUNTER — Ambulatory Visit: Payer: BC Managed Care – PPO | Admitting: Clinical

## 2020-05-13 ENCOUNTER — Ambulatory Visit: Payer: BC Managed Care – PPO | Admitting: Clinical

## 2020-05-19 DIAGNOSIS — R69 Illness, unspecified: Secondary | ICD-10-CM | POA: Diagnosis not present

## 2020-05-19 DIAGNOSIS — F332 Major depressive disorder, recurrent severe without psychotic features: Secondary | ICD-10-CM | POA: Diagnosis not present

## 2020-05-19 DIAGNOSIS — F649 Gender identity disorder, unspecified: Secondary | ICD-10-CM | POA: Diagnosis not present

## 2020-05-20 DIAGNOSIS — F649 Gender identity disorder, unspecified: Secondary | ICD-10-CM | POA: Diagnosis not present

## 2020-05-20 DIAGNOSIS — Z79818 Long term (current) use of other agents affecting estrogen receptors and estrogen levels: Secondary | ICD-10-CM | POA: Diagnosis not present

## 2020-05-20 DIAGNOSIS — Z7952 Long term (current) use of systemic steroids: Secondary | ICD-10-CM | POA: Diagnosis not present

## 2020-05-20 DIAGNOSIS — L649 Androgenic alopecia, unspecified: Secondary | ICD-10-CM | POA: Diagnosis not present

## 2020-05-20 DIAGNOSIS — Z79890 Hormone replacement therapy (postmenopausal): Secondary | ICD-10-CM | POA: Diagnosis not present

## 2020-05-20 DIAGNOSIS — R69 Illness, unspecified: Secondary | ICD-10-CM | POA: Diagnosis not present

## 2020-05-20 DIAGNOSIS — Z79899 Other long term (current) drug therapy: Secondary | ICD-10-CM | POA: Diagnosis not present

## 2020-05-22 DIAGNOSIS — D72820 Lymphocytosis (symptomatic): Secondary | ICD-10-CM | POA: Diagnosis not present

## 2020-05-22 DIAGNOSIS — F419 Anxiety disorder, unspecified: Secondary | ICD-10-CM | POA: Diagnosis not present

## 2020-05-22 DIAGNOSIS — Z79899 Other long term (current) drug therapy: Secondary | ICD-10-CM | POA: Diagnosis not present

## 2020-05-22 DIAGNOSIS — F330 Major depressive disorder, recurrent, mild: Secondary | ICD-10-CM | POA: Diagnosis not present

## 2020-05-22 DIAGNOSIS — F900 Attention-deficit hyperactivity disorder, predominantly inattentive type: Secondary | ICD-10-CM | POA: Diagnosis not present

## 2020-05-22 DIAGNOSIS — L649 Androgenic alopecia, unspecified: Secondary | ICD-10-CM | POA: Diagnosis not present

## 2020-05-22 DIAGNOSIS — R69 Illness, unspecified: Secondary | ICD-10-CM | POA: Diagnosis not present

## 2020-05-22 DIAGNOSIS — Z79818 Long term (current) use of other agents affecting estrogen receptors and estrogen levels: Secondary | ICD-10-CM | POA: Diagnosis not present

## 2020-05-27 ENCOUNTER — Ambulatory Visit: Payer: BC Managed Care – PPO | Admitting: Clinical

## 2020-05-28 DIAGNOSIS — R69 Illness, unspecified: Secondary | ICD-10-CM | POA: Diagnosis not present

## 2020-06-10 ENCOUNTER — Ambulatory Visit: Payer: BC Managed Care – PPO | Admitting: Clinical

## 2020-06-24 ENCOUNTER — Ambulatory Visit: Payer: BC Managed Care – PPO | Admitting: Clinical

## 2020-06-26 DIAGNOSIS — M79675 Pain in left toe(s): Secondary | ICD-10-CM | POA: Diagnosis not present

## 2020-07-01 DIAGNOSIS — R42 Dizziness and giddiness: Secondary | ICD-10-CM | POA: Diagnosis not present

## 2020-07-05 DIAGNOSIS — H9202 Otalgia, left ear: Secondary | ICD-10-CM | POA: Diagnosis not present

## 2020-07-05 DIAGNOSIS — H60502 Unspecified acute noninfective otitis externa, left ear: Secondary | ICD-10-CM | POA: Diagnosis not present

## 2020-07-08 ENCOUNTER — Ambulatory Visit: Payer: BC Managed Care – PPO | Admitting: Clinical

## 2020-07-25 DIAGNOSIS — R69 Illness, unspecified: Secondary | ICD-10-CM | POA: Diagnosis not present

## 2020-07-25 DIAGNOSIS — E663 Overweight: Secondary | ICD-10-CM | POA: Diagnosis not present

## 2020-07-25 DIAGNOSIS — Z79818 Long term (current) use of other agents affecting estrogen receptors and estrogen levels: Secondary | ICD-10-CM | POA: Diagnosis not present

## 2020-07-25 DIAGNOSIS — L649 Androgenic alopecia, unspecified: Secondary | ICD-10-CM | POA: Diagnosis not present

## 2020-07-25 DIAGNOSIS — Z79890 Hormone replacement therapy (postmenopausal): Secondary | ICD-10-CM | POA: Diagnosis not present

## 2020-07-25 DIAGNOSIS — Z79899 Other long term (current) drug therapy: Secondary | ICD-10-CM | POA: Diagnosis not present

## 2020-07-31 DIAGNOSIS — R69 Illness, unspecified: Secondary | ICD-10-CM | POA: Diagnosis not present

## 2020-07-31 DIAGNOSIS — F900 Attention-deficit hyperactivity disorder, predominantly inattentive type: Secondary | ICD-10-CM | POA: Diagnosis not present

## 2020-07-31 DIAGNOSIS — F419 Anxiety disorder, unspecified: Secondary | ICD-10-CM | POA: Diagnosis not present

## 2020-07-31 DIAGNOSIS — L649 Androgenic alopecia, unspecified: Secondary | ICD-10-CM | POA: Diagnosis not present

## 2020-07-31 DIAGNOSIS — Z87890 Personal history of sex reassignment: Secondary | ICD-10-CM | POA: Diagnosis not present

## 2020-07-31 DIAGNOSIS — F330 Major depressive disorder, recurrent, mild: Secondary | ICD-10-CM | POA: Diagnosis not present

## 2020-07-31 DIAGNOSIS — Z79818 Long term (current) use of other agents affecting estrogen receptors and estrogen levels: Secondary | ICD-10-CM | POA: Diagnosis not present

## 2020-09-14 DIAGNOSIS — R69 Illness, unspecified: Secondary | ICD-10-CM | POA: Diagnosis not present

## 2020-09-14 DIAGNOSIS — F649 Gender identity disorder, unspecified: Secondary | ICD-10-CM | POA: Diagnosis not present

## 2020-09-14 DIAGNOSIS — F332 Major depressive disorder, recurrent severe without psychotic features: Secondary | ICD-10-CM | POA: Diagnosis not present

## 2020-10-16 DIAGNOSIS — L649 Androgenic alopecia, unspecified: Secondary | ICD-10-CM | POA: Diagnosis not present

## 2020-10-16 DIAGNOSIS — Z79818 Long term (current) use of other agents affecting estrogen receptors and estrogen levels: Secondary | ICD-10-CM | POA: Diagnosis not present

## 2020-10-16 DIAGNOSIS — Z79890 Hormone replacement therapy (postmenopausal): Secondary | ICD-10-CM | POA: Diagnosis not present

## 2020-10-16 DIAGNOSIS — E663 Overweight: Secondary | ICD-10-CM | POA: Diagnosis not present

## 2020-10-16 DIAGNOSIS — Z79899 Other long term (current) drug therapy: Secondary | ICD-10-CM | POA: Diagnosis not present

## 2020-10-16 DIAGNOSIS — E221 Hyperprolactinemia: Secondary | ICD-10-CM | POA: Diagnosis not present

## 2020-10-16 DIAGNOSIS — R69 Illness, unspecified: Secondary | ICD-10-CM | POA: Diagnosis not present

## 2020-10-25 DIAGNOSIS — H6692 Otitis media, unspecified, left ear: Secondary | ICD-10-CM | POA: Diagnosis not present

## 2020-10-25 DIAGNOSIS — H7192 Unspecified cholesteatoma, left ear: Secondary | ICD-10-CM | POA: Diagnosis not present

## 2020-10-28 DIAGNOSIS — H7192 Unspecified cholesteatoma, left ear: Secondary | ICD-10-CM | POA: Diagnosis not present

## 2020-10-28 DIAGNOSIS — H60509 Unspecified acute noninfective otitis externa, unspecified ear: Secondary | ICD-10-CM | POA: Diagnosis not present

## 2020-10-28 DIAGNOSIS — H9202 Otalgia, left ear: Secondary | ICD-10-CM | POA: Diagnosis not present

## 2020-11-01 DIAGNOSIS — H7202 Central perforation of tympanic membrane, left ear: Secondary | ICD-10-CM | POA: Diagnosis not present

## 2020-11-01 DIAGNOSIS — B369 Superficial mycosis, unspecified: Secondary | ICD-10-CM | POA: Diagnosis not present

## 2020-11-01 DIAGNOSIS — H6242 Otitis externa in other diseases classified elsewhere, left ear: Secondary | ICD-10-CM | POA: Diagnosis not present

## 2020-11-01 DIAGNOSIS — H6122 Impacted cerumen, left ear: Secondary | ICD-10-CM | POA: Diagnosis not present

## 2020-11-02 DIAGNOSIS — F332 Major depressive disorder, recurrent severe without psychotic features: Secondary | ICD-10-CM | POA: Diagnosis not present

## 2020-11-02 DIAGNOSIS — R69 Illness, unspecified: Secondary | ICD-10-CM | POA: Diagnosis not present

## 2020-11-02 DIAGNOSIS — F649 Gender identity disorder, unspecified: Secondary | ICD-10-CM | POA: Diagnosis not present

## 2020-11-14 DIAGNOSIS — F331 Major depressive disorder, recurrent, moderate: Secondary | ICD-10-CM | POA: Diagnosis not present

## 2020-11-14 DIAGNOSIS — F411 Generalized anxiety disorder: Secondary | ICD-10-CM | POA: Diagnosis not present

## 2020-11-14 DIAGNOSIS — R69 Illness, unspecified: Secondary | ICD-10-CM | POA: Diagnosis not present

## 2020-11-19 DIAGNOSIS — F411 Generalized anxiety disorder: Secondary | ICD-10-CM | POA: Diagnosis not present

## 2020-11-19 DIAGNOSIS — F331 Major depressive disorder, recurrent, moderate: Secondary | ICD-10-CM | POA: Diagnosis not present

## 2020-11-19 DIAGNOSIS — R69 Illness, unspecified: Secondary | ICD-10-CM | POA: Diagnosis not present

## 2020-11-21 DIAGNOSIS — H6240 Otitis externa in other diseases classified elsewhere, unspecified ear: Secondary | ICD-10-CM | POA: Diagnosis not present

## 2020-11-21 DIAGNOSIS — B369 Superficial mycosis, unspecified: Secondary | ICD-10-CM | POA: Diagnosis not present

## 2020-11-21 DIAGNOSIS — H7202 Central perforation of tympanic membrane, left ear: Secondary | ICD-10-CM | POA: Diagnosis not present

## 2020-12-05 DIAGNOSIS — R69 Illness, unspecified: Secondary | ICD-10-CM | POA: Diagnosis not present

## 2020-12-05 DIAGNOSIS — F411 Generalized anxiety disorder: Secondary | ICD-10-CM | POA: Diagnosis not present

## 2020-12-05 DIAGNOSIS — F900 Attention-deficit hyperactivity disorder, predominantly inattentive type: Secondary | ICD-10-CM | POA: Diagnosis not present

## 2020-12-19 DIAGNOSIS — F900 Attention-deficit hyperactivity disorder, predominantly inattentive type: Secondary | ICD-10-CM | POA: Diagnosis not present

## 2020-12-19 DIAGNOSIS — R69 Illness, unspecified: Secondary | ICD-10-CM | POA: Diagnosis not present

## 2020-12-19 DIAGNOSIS — F411 Generalized anxiety disorder: Secondary | ICD-10-CM | POA: Diagnosis not present

## 2021-01-02 DIAGNOSIS — F900 Attention-deficit hyperactivity disorder, predominantly inattentive type: Secondary | ICD-10-CM | POA: Diagnosis not present

## 2021-01-02 DIAGNOSIS — F411 Generalized anxiety disorder: Secondary | ICD-10-CM | POA: Diagnosis not present

## 2021-01-02 DIAGNOSIS — R69 Illness, unspecified: Secondary | ICD-10-CM | POA: Diagnosis not present

## 2021-01-08 DIAGNOSIS — Z79890 Hormone replacement therapy (postmenopausal): Secondary | ICD-10-CM | POA: Diagnosis not present

## 2021-01-08 DIAGNOSIS — F649 Gender identity disorder, unspecified: Secondary | ICD-10-CM | POA: Diagnosis not present

## 2021-01-08 DIAGNOSIS — R69 Illness, unspecified: Secondary | ICD-10-CM | POA: Diagnosis not present

## 2021-01-08 DIAGNOSIS — Z79899 Other long term (current) drug therapy: Secondary | ICD-10-CM | POA: Diagnosis not present

## 2021-01-08 DIAGNOSIS — Z79818 Long term (current) use of other agents affecting estrogen receptors and estrogen levels: Secondary | ICD-10-CM | POA: Diagnosis not present

## 2021-01-15 DIAGNOSIS — F330 Major depressive disorder, recurrent, mild: Secondary | ICD-10-CM | POA: Diagnosis not present

## 2021-01-15 DIAGNOSIS — F649 Gender identity disorder, unspecified: Secondary | ICD-10-CM | POA: Diagnosis not present

## 2021-01-15 DIAGNOSIS — R69 Illness, unspecified: Secondary | ICD-10-CM | POA: Diagnosis not present

## 2021-01-15 DIAGNOSIS — Z79818 Long term (current) use of other agents affecting estrogen receptors and estrogen levels: Secondary | ICD-10-CM | POA: Diagnosis not present

## 2021-01-15 DIAGNOSIS — L649 Androgenic alopecia, unspecified: Secondary | ICD-10-CM | POA: Diagnosis not present

## 2021-01-15 DIAGNOSIS — F900 Attention-deficit hyperactivity disorder, predominantly inattentive type: Secondary | ICD-10-CM | POA: Diagnosis not present

## 2021-01-27 DIAGNOSIS — F411 Generalized anxiety disorder: Secondary | ICD-10-CM | POA: Diagnosis not present

## 2021-01-27 DIAGNOSIS — F900 Attention-deficit hyperactivity disorder, predominantly inattentive type: Secondary | ICD-10-CM | POA: Diagnosis not present

## 2021-01-27 DIAGNOSIS — R69 Illness, unspecified: Secondary | ICD-10-CM | POA: Diagnosis not present

## 2021-02-27 DIAGNOSIS — F411 Generalized anxiety disorder: Secondary | ICD-10-CM | POA: Diagnosis not present

## 2021-02-27 DIAGNOSIS — R69 Illness, unspecified: Secondary | ICD-10-CM | POA: Diagnosis not present

## 2021-02-27 DIAGNOSIS — F900 Attention-deficit hyperactivity disorder, predominantly inattentive type: Secondary | ICD-10-CM | POA: Diagnosis not present

## 2021-03-26 DIAGNOSIS — R197 Diarrhea, unspecified: Secondary | ICD-10-CM | POA: Diagnosis not present

## 2021-04-04 DIAGNOSIS — B369 Superficial mycosis, unspecified: Secondary | ICD-10-CM | POA: Diagnosis not present

## 2021-04-04 DIAGNOSIS — H6243 Otitis externa in other diseases classified elsewhere, bilateral: Secondary | ICD-10-CM | POA: Diagnosis not present

## 2021-04-09 DIAGNOSIS — Z20828 Contact with and (suspected) exposure to other viral communicable diseases: Secondary | ICD-10-CM | POA: Diagnosis not present

## 2021-04-09 DIAGNOSIS — R5383 Other fatigue: Secondary | ICD-10-CM | POA: Diagnosis not present

## 2021-04-24 DIAGNOSIS — F900 Attention-deficit hyperactivity disorder, predominantly inattentive type: Secondary | ICD-10-CM | POA: Diagnosis not present

## 2021-04-24 DIAGNOSIS — R69 Illness, unspecified: Secondary | ICD-10-CM | POA: Diagnosis not present

## 2021-04-24 DIAGNOSIS — F411 Generalized anxiety disorder: Secondary | ICD-10-CM | POA: Diagnosis not present

## 2021-04-30 DIAGNOSIS — Z79899 Other long term (current) drug therapy: Secondary | ICD-10-CM | POA: Diagnosis not present

## 2021-04-30 DIAGNOSIS — Z79890 Hormone replacement therapy (postmenopausal): Secondary | ICD-10-CM | POA: Diagnosis not present

## 2021-04-30 DIAGNOSIS — Z79818 Long term (current) use of other agents affecting estrogen receptors and estrogen levels: Secondary | ICD-10-CM | POA: Diagnosis not present

## 2021-04-30 DIAGNOSIS — E663 Overweight: Secondary | ICD-10-CM | POA: Diagnosis not present

## 2021-04-30 DIAGNOSIS — R69 Illness, unspecified: Secondary | ICD-10-CM | POA: Diagnosis not present

## 2021-04-30 DIAGNOSIS — F641 Gender identity disorder in adolescence and adulthood: Secondary | ICD-10-CM | POA: Diagnosis not present

## 2021-05-08 DIAGNOSIS — F900 Attention-deficit hyperactivity disorder, predominantly inattentive type: Secondary | ICD-10-CM | POA: Diagnosis not present

## 2021-05-08 DIAGNOSIS — F649 Gender identity disorder, unspecified: Secondary | ICD-10-CM | POA: Diagnosis not present

## 2021-05-08 DIAGNOSIS — L649 Androgenic alopecia, unspecified: Secondary | ICD-10-CM | POA: Diagnosis not present

## 2021-05-08 DIAGNOSIS — Z79818 Long term (current) use of other agents affecting estrogen receptors and estrogen levels: Secondary | ICD-10-CM | POA: Diagnosis not present

## 2021-05-08 DIAGNOSIS — F330 Major depressive disorder, recurrent, mild: Secondary | ICD-10-CM | POA: Diagnosis not present

## 2021-05-08 DIAGNOSIS — R69 Illness, unspecified: Secondary | ICD-10-CM | POA: Diagnosis not present

## 2021-05-08 DIAGNOSIS — F419 Anxiety disorder, unspecified: Secondary | ICD-10-CM | POA: Diagnosis not present

## 2021-06-10 DIAGNOSIS — F411 Generalized anxiety disorder: Secondary | ICD-10-CM | POA: Diagnosis not present

## 2021-06-10 DIAGNOSIS — R69 Illness, unspecified: Secondary | ICD-10-CM | POA: Diagnosis not present

## 2021-06-10 DIAGNOSIS — F900 Attention-deficit hyperactivity disorder, predominantly inattentive type: Secondary | ICD-10-CM | POA: Diagnosis not present

## 2021-06-17 DIAGNOSIS — F900 Attention-deficit hyperactivity disorder, predominantly inattentive type: Secondary | ICD-10-CM | POA: Diagnosis not present

## 2021-06-17 DIAGNOSIS — R69 Illness, unspecified: Secondary | ICD-10-CM | POA: Diagnosis not present

## 2021-06-17 DIAGNOSIS — F411 Generalized anxiety disorder: Secondary | ICD-10-CM | POA: Diagnosis not present

## 2021-07-01 DIAGNOSIS — F411 Generalized anxiety disorder: Secondary | ICD-10-CM | POA: Diagnosis not present

## 2021-07-01 DIAGNOSIS — F900 Attention-deficit hyperactivity disorder, predominantly inattentive type: Secondary | ICD-10-CM | POA: Diagnosis not present

## 2021-07-01 DIAGNOSIS — R69 Illness, unspecified: Secondary | ICD-10-CM | POA: Diagnosis not present

## 2021-07-08 DIAGNOSIS — R69 Illness, unspecified: Secondary | ICD-10-CM | POA: Diagnosis not present

## 2021-07-08 DIAGNOSIS — F900 Attention-deficit hyperactivity disorder, predominantly inattentive type: Secondary | ICD-10-CM | POA: Diagnosis not present

## 2021-07-08 DIAGNOSIS — F411 Generalized anxiety disorder: Secondary | ICD-10-CM | POA: Diagnosis not present

## 2021-07-15 DIAGNOSIS — F649 Gender identity disorder, unspecified: Secondary | ICD-10-CM | POA: Diagnosis not present

## 2021-07-15 DIAGNOSIS — L649 Androgenic alopecia, unspecified: Secondary | ICD-10-CM | POA: Diagnosis not present

## 2021-07-15 DIAGNOSIS — F900 Attention-deficit hyperactivity disorder, predominantly inattentive type: Secondary | ICD-10-CM | POA: Diagnosis not present

## 2021-07-15 DIAGNOSIS — R69 Illness, unspecified: Secondary | ICD-10-CM | POA: Diagnosis not present

## 2021-07-15 DIAGNOSIS — F419 Anxiety disorder, unspecified: Secondary | ICD-10-CM | POA: Diagnosis not present

## 2021-07-15 DIAGNOSIS — Z79818 Long term (current) use of other agents affecting estrogen receptors and estrogen levels: Secondary | ICD-10-CM | POA: Diagnosis not present

## 2021-10-23 DIAGNOSIS — L649 Androgenic alopecia, unspecified: Secondary | ICD-10-CM | POA: Diagnosis not present

## 2021-10-23 DIAGNOSIS — F419 Anxiety disorder, unspecified: Secondary | ICD-10-CM | POA: Diagnosis not present

## 2021-10-23 DIAGNOSIS — F649 Gender identity disorder, unspecified: Secondary | ICD-10-CM | POA: Diagnosis not present

## 2021-10-23 DIAGNOSIS — R69 Illness, unspecified: Secondary | ICD-10-CM | POA: Diagnosis not present

## 2021-10-23 DIAGNOSIS — F330 Major depressive disorder, recurrent, mild: Secondary | ICD-10-CM | POA: Diagnosis not present

## 2021-10-23 DIAGNOSIS — L609 Nail disorder, unspecified: Secondary | ICD-10-CM | POA: Diagnosis not present

## 2021-10-23 DIAGNOSIS — Z79818 Long term (current) use of other agents affecting estrogen receptors and estrogen levels: Secondary | ICD-10-CM | POA: Diagnosis not present

## 2021-10-25 DIAGNOSIS — Z23 Encounter for immunization: Secondary | ICD-10-CM | POA: Diagnosis not present

## 2021-10-27 DIAGNOSIS — R69 Illness, unspecified: Secondary | ICD-10-CM | POA: Diagnosis not present

## 2021-10-27 DIAGNOSIS — Z7952 Long term (current) use of systemic steroids: Secondary | ICD-10-CM | POA: Diagnosis not present

## 2021-10-27 DIAGNOSIS — Z79890 Hormone replacement therapy (postmenopausal): Secondary | ICD-10-CM | POA: Diagnosis not present

## 2021-10-27 DIAGNOSIS — Z79818 Long term (current) use of other agents affecting estrogen receptors and estrogen levels: Secondary | ICD-10-CM | POA: Diagnosis not present

## 2021-10-27 DIAGNOSIS — Z79899 Other long term (current) drug therapy: Secondary | ICD-10-CM | POA: Diagnosis not present

## 2021-11-03 DIAGNOSIS — L601 Onycholysis: Secondary | ICD-10-CM | POA: Diagnosis not present

## 2021-11-11 DIAGNOSIS — R519 Headache, unspecified: Secondary | ICD-10-CM | POA: Diagnosis not present

## 2021-11-13 DIAGNOSIS — Z79818 Long term (current) use of other agents affecting estrogen receptors and estrogen levels: Secondary | ICD-10-CM | POA: Diagnosis not present

## 2021-11-13 DIAGNOSIS — R519 Headache, unspecified: Secondary | ICD-10-CM | POA: Diagnosis not present

## 2021-11-13 DIAGNOSIS — R69 Illness, unspecified: Secondary | ICD-10-CM | POA: Diagnosis not present

## 2021-11-13 DIAGNOSIS — L649 Androgenic alopecia, unspecified: Secondary | ICD-10-CM | POA: Diagnosis not present

## 2021-11-27 DIAGNOSIS — F411 Generalized anxiety disorder: Secondary | ICD-10-CM | POA: Diagnosis not present

## 2021-11-27 DIAGNOSIS — F900 Attention-deficit hyperactivity disorder, predominantly inattentive type: Secondary | ICD-10-CM | POA: Diagnosis not present

## 2021-11-27 DIAGNOSIS — R69 Illness, unspecified: Secondary | ICD-10-CM | POA: Diagnosis not present

## 2021-12-11 DIAGNOSIS — F411 Generalized anxiety disorder: Secondary | ICD-10-CM | POA: Diagnosis not present

## 2021-12-11 DIAGNOSIS — F900 Attention-deficit hyperactivity disorder, predominantly inattentive type: Secondary | ICD-10-CM | POA: Diagnosis not present

## 2021-12-11 DIAGNOSIS — R69 Illness, unspecified: Secondary | ICD-10-CM | POA: Diagnosis not present

## 2021-12-17 DIAGNOSIS — R69 Illness, unspecified: Secondary | ICD-10-CM | POA: Diagnosis not present

## 2021-12-17 DIAGNOSIS — F900 Attention-deficit hyperactivity disorder, predominantly inattentive type: Secondary | ICD-10-CM | POA: Diagnosis not present

## 2021-12-17 DIAGNOSIS — F411 Generalized anxiety disorder: Secondary | ICD-10-CM | POA: Diagnosis not present

## 2021-12-25 DIAGNOSIS — F900 Attention-deficit hyperactivity disorder, predominantly inattentive type: Secondary | ICD-10-CM | POA: Diagnosis not present

## 2021-12-25 DIAGNOSIS — F411 Generalized anxiety disorder: Secondary | ICD-10-CM | POA: Diagnosis not present

## 2021-12-25 DIAGNOSIS — R69 Illness, unspecified: Secondary | ICD-10-CM | POA: Diagnosis not present

## 2022-01-02 DIAGNOSIS — R69 Illness, unspecified: Secondary | ICD-10-CM | POA: Diagnosis not present

## 2022-01-02 DIAGNOSIS — F900 Attention-deficit hyperactivity disorder, predominantly inattentive type: Secondary | ICD-10-CM | POA: Diagnosis not present

## 2022-01-02 DIAGNOSIS — F411 Generalized anxiety disorder: Secondary | ICD-10-CM | POA: Diagnosis not present

## 2022-01-16 DIAGNOSIS — R69 Illness, unspecified: Secondary | ICD-10-CM | POA: Diagnosis not present

## 2022-01-16 DIAGNOSIS — F411 Generalized anxiety disorder: Secondary | ICD-10-CM | POA: Diagnosis not present

## 2022-01-16 DIAGNOSIS — F900 Attention-deficit hyperactivity disorder, predominantly inattentive type: Secondary | ICD-10-CM | POA: Diagnosis not present

## 2022-01-28 DIAGNOSIS — F411 Generalized anxiety disorder: Secondary | ICD-10-CM | POA: Diagnosis not present

## 2022-01-28 DIAGNOSIS — F900 Attention-deficit hyperactivity disorder, predominantly inattentive type: Secondary | ICD-10-CM | POA: Diagnosis not present

## 2022-01-28 DIAGNOSIS — R69 Illness, unspecified: Secondary | ICD-10-CM | POA: Diagnosis not present

## 2022-02-11 DIAGNOSIS — F900 Attention-deficit hyperactivity disorder, predominantly inattentive type: Secondary | ICD-10-CM | POA: Diagnosis not present

## 2022-02-11 DIAGNOSIS — F411 Generalized anxiety disorder: Secondary | ICD-10-CM | POA: Diagnosis not present

## 2022-02-11 DIAGNOSIS — R69 Illness, unspecified: Secondary | ICD-10-CM | POA: Diagnosis not present

## 2022-02-27 DIAGNOSIS — R69 Illness, unspecified: Secondary | ICD-10-CM | POA: Diagnosis not present

## 2022-02-27 DIAGNOSIS — F900 Attention-deficit hyperactivity disorder, predominantly inattentive type: Secondary | ICD-10-CM | POA: Diagnosis not present

## 2022-02-27 DIAGNOSIS — F411 Generalized anxiety disorder: Secondary | ICD-10-CM | POA: Diagnosis not present

## 2022-03-26 DIAGNOSIS — R69 Illness, unspecified: Secondary | ICD-10-CM | POA: Diagnosis not present

## 2022-03-26 DIAGNOSIS — F900 Attention-deficit hyperactivity disorder, predominantly inattentive type: Secondary | ICD-10-CM | POA: Diagnosis not present

## 2022-03-26 DIAGNOSIS — F411 Generalized anxiety disorder: Secondary | ICD-10-CM | POA: Diagnosis not present

## 2022-04-10 DIAGNOSIS — R69 Illness, unspecified: Secondary | ICD-10-CM | POA: Diagnosis not present

## 2022-04-10 DIAGNOSIS — F900 Attention-deficit hyperactivity disorder, predominantly inattentive type: Secondary | ICD-10-CM | POA: Diagnosis not present

## 2022-04-10 DIAGNOSIS — F411 Generalized anxiety disorder: Secondary | ICD-10-CM | POA: Diagnosis not present

## 2022-04-29 DIAGNOSIS — F411 Generalized anxiety disorder: Secondary | ICD-10-CM | POA: Diagnosis not present

## 2022-04-29 DIAGNOSIS — F900 Attention-deficit hyperactivity disorder, predominantly inattentive type: Secondary | ICD-10-CM | POA: Diagnosis not present

## 2022-04-29 DIAGNOSIS — R69 Illness, unspecified: Secondary | ICD-10-CM | POA: Diagnosis not present

## 2022-05-12 ENCOUNTER — Encounter: Payer: Self-pay | Admitting: *Deleted

## 2022-05-12 DIAGNOSIS — F411 Generalized anxiety disorder: Secondary | ICD-10-CM | POA: Diagnosis not present

## 2022-05-12 DIAGNOSIS — R69 Illness, unspecified: Secondary | ICD-10-CM | POA: Diagnosis not present

## 2022-05-12 DIAGNOSIS — F900 Attention-deficit hyperactivity disorder, predominantly inattentive type: Secondary | ICD-10-CM | POA: Diagnosis not present

## 2022-05-22 DIAGNOSIS — F900 Attention-deficit hyperactivity disorder, predominantly inattentive type: Secondary | ICD-10-CM | POA: Diagnosis not present

## 2022-05-22 DIAGNOSIS — R69 Illness, unspecified: Secondary | ICD-10-CM | POA: Diagnosis not present

## 2022-05-22 DIAGNOSIS — F411 Generalized anxiety disorder: Secondary | ICD-10-CM | POA: Diagnosis not present

## 2022-05-29 DIAGNOSIS — F411 Generalized anxiety disorder: Secondary | ICD-10-CM | POA: Diagnosis not present

## 2022-05-29 DIAGNOSIS — R69 Illness, unspecified: Secondary | ICD-10-CM | POA: Diagnosis not present

## 2022-05-29 DIAGNOSIS — F900 Attention-deficit hyperactivity disorder, predominantly inattentive type: Secondary | ICD-10-CM | POA: Diagnosis not present

## 2022-06-24 DIAGNOSIS — R69 Illness, unspecified: Secondary | ICD-10-CM | POA: Diagnosis not present

## 2022-06-24 DIAGNOSIS — F900 Attention-deficit hyperactivity disorder, predominantly inattentive type: Secondary | ICD-10-CM | POA: Diagnosis not present

## 2022-06-24 DIAGNOSIS — F411 Generalized anxiety disorder: Secondary | ICD-10-CM | POA: Diagnosis not present

## 2022-07-10 DIAGNOSIS — F900 Attention-deficit hyperactivity disorder, predominantly inattentive type: Secondary | ICD-10-CM | POA: Diagnosis not present

## 2022-07-10 DIAGNOSIS — F411 Generalized anxiety disorder: Secondary | ICD-10-CM | POA: Diagnosis not present

## 2022-07-10 DIAGNOSIS — R69 Illness, unspecified: Secondary | ICD-10-CM | POA: Diagnosis not present

## 2022-07-24 DIAGNOSIS — R69 Illness, unspecified: Secondary | ICD-10-CM | POA: Diagnosis not present

## 2022-07-24 DIAGNOSIS — F411 Generalized anxiety disorder: Secondary | ICD-10-CM | POA: Diagnosis not present

## 2022-07-24 DIAGNOSIS — F900 Attention-deficit hyperactivity disorder, predominantly inattentive type: Secondary | ICD-10-CM | POA: Diagnosis not present

## 2022-08-06 DIAGNOSIS — F411 Generalized anxiety disorder: Secondary | ICD-10-CM | POA: Diagnosis not present

## 2022-08-06 DIAGNOSIS — R69 Illness, unspecified: Secondary | ICD-10-CM | POA: Diagnosis not present

## 2022-08-06 DIAGNOSIS — F900 Attention-deficit hyperactivity disorder, predominantly inattentive type: Secondary | ICD-10-CM | POA: Diagnosis not present

## 2022-08-19 DIAGNOSIS — F411 Generalized anxiety disorder: Secondary | ICD-10-CM | POA: Diagnosis not present

## 2022-08-19 DIAGNOSIS — F900 Attention-deficit hyperactivity disorder, predominantly inattentive type: Secondary | ICD-10-CM | POA: Diagnosis not present

## 2022-08-19 DIAGNOSIS — R69 Illness, unspecified: Secondary | ICD-10-CM | POA: Diagnosis not present

## 2022-09-03 DIAGNOSIS — R69 Illness, unspecified: Secondary | ICD-10-CM | POA: Diagnosis not present

## 2022-09-03 DIAGNOSIS — F900 Attention-deficit hyperactivity disorder, predominantly inattentive type: Secondary | ICD-10-CM | POA: Diagnosis not present

## 2022-09-03 DIAGNOSIS — F411 Generalized anxiety disorder: Secondary | ICD-10-CM | POA: Diagnosis not present

## 2022-09-18 DIAGNOSIS — F411 Generalized anxiety disorder: Secondary | ICD-10-CM | POA: Diagnosis not present

## 2022-09-18 DIAGNOSIS — F900 Attention-deficit hyperactivity disorder, predominantly inattentive type: Secondary | ICD-10-CM | POA: Diagnosis not present

## 2022-09-18 DIAGNOSIS — R69 Illness, unspecified: Secondary | ICD-10-CM | POA: Diagnosis not present

## 2022-10-01 DIAGNOSIS — R69 Illness, unspecified: Secondary | ICD-10-CM | POA: Diagnosis not present

## 2022-10-01 DIAGNOSIS — F900 Attention-deficit hyperactivity disorder, predominantly inattentive type: Secondary | ICD-10-CM | POA: Diagnosis not present

## 2022-10-01 DIAGNOSIS — F411 Generalized anxiety disorder: Secondary | ICD-10-CM | POA: Diagnosis not present

## 2022-11-10 DIAGNOSIS — R69 Illness, unspecified: Secondary | ICD-10-CM | POA: Diagnosis not present

## 2022-11-10 DIAGNOSIS — F411 Generalized anxiety disorder: Secondary | ICD-10-CM | POA: Diagnosis not present

## 2022-11-10 DIAGNOSIS — F900 Attention-deficit hyperactivity disorder, predominantly inattentive type: Secondary | ICD-10-CM | POA: Diagnosis not present

## 2022-11-24 DIAGNOSIS — H9201 Otalgia, right ear: Secondary | ICD-10-CM | POA: Diagnosis not present

## 2022-11-24 DIAGNOSIS — J069 Acute upper respiratory infection, unspecified: Secondary | ICD-10-CM | POA: Diagnosis not present

## 2022-12-15 DIAGNOSIS — F900 Attention-deficit hyperactivity disorder, predominantly inattentive type: Secondary | ICD-10-CM | POA: Diagnosis not present

## 2022-12-15 DIAGNOSIS — R69 Illness, unspecified: Secondary | ICD-10-CM | POA: Diagnosis not present

## 2022-12-15 DIAGNOSIS — F411 Generalized anxiety disorder: Secondary | ICD-10-CM | POA: Diagnosis not present

## 2022-12-25 DIAGNOSIS — F411 Generalized anxiety disorder: Secondary | ICD-10-CM | POA: Diagnosis not present

## 2022-12-25 DIAGNOSIS — F900 Attention-deficit hyperactivity disorder, predominantly inattentive type: Secondary | ICD-10-CM | POA: Diagnosis not present

## 2022-12-25 DIAGNOSIS — F331 Major depressive disorder, recurrent, moderate: Secondary | ICD-10-CM | POA: Diagnosis not present

## 2023-01-01 DIAGNOSIS — F900 Attention-deficit hyperactivity disorder, predominantly inattentive type: Secondary | ICD-10-CM | POA: Diagnosis not present

## 2023-01-01 DIAGNOSIS — F331 Major depressive disorder, recurrent, moderate: Secondary | ICD-10-CM | POA: Diagnosis not present

## 2023-01-01 DIAGNOSIS — F411 Generalized anxiety disorder: Secondary | ICD-10-CM | POA: Diagnosis not present

## 2023-01-14 DIAGNOSIS — R69 Illness, unspecified: Secondary | ICD-10-CM | POA: Diagnosis not present

## 2023-01-14 DIAGNOSIS — F411 Generalized anxiety disorder: Secondary | ICD-10-CM | POA: Diagnosis not present

## 2023-01-14 DIAGNOSIS — F900 Attention-deficit hyperactivity disorder, predominantly inattentive type: Secondary | ICD-10-CM | POA: Diagnosis not present

## 2023-01-29 DIAGNOSIS — F411 Generalized anxiety disorder: Secondary | ICD-10-CM | POA: Diagnosis not present

## 2023-01-29 DIAGNOSIS — F331 Major depressive disorder, recurrent, moderate: Secondary | ICD-10-CM | POA: Diagnosis not present

## 2023-01-29 DIAGNOSIS — F900 Attention-deficit hyperactivity disorder, predominantly inattentive type: Secondary | ICD-10-CM | POA: Diagnosis not present

## 2023-02-12 DIAGNOSIS — F331 Major depressive disorder, recurrent, moderate: Secondary | ICD-10-CM | POA: Diagnosis not present

## 2023-02-12 DIAGNOSIS — F411 Generalized anxiety disorder: Secondary | ICD-10-CM | POA: Diagnosis not present

## 2023-02-12 DIAGNOSIS — F900 Attention-deficit hyperactivity disorder, predominantly inattentive type: Secondary | ICD-10-CM | POA: Diagnosis not present

## 2023-02-23 DIAGNOSIS — F331 Major depressive disorder, recurrent, moderate: Secondary | ICD-10-CM | POA: Diagnosis not present

## 2023-02-23 DIAGNOSIS — F411 Generalized anxiety disorder: Secondary | ICD-10-CM | POA: Diagnosis not present

## 2023-02-23 DIAGNOSIS — F900 Attention-deficit hyperactivity disorder, predominantly inattentive type: Secondary | ICD-10-CM | POA: Diagnosis not present

## 2023-03-11 DIAGNOSIS — F411 Generalized anxiety disorder: Secondary | ICD-10-CM | POA: Diagnosis not present

## 2023-03-11 DIAGNOSIS — F331 Major depressive disorder, recurrent, moderate: Secondary | ICD-10-CM | POA: Diagnosis not present

## 2023-03-11 DIAGNOSIS — F900 Attention-deficit hyperactivity disorder, predominantly inattentive type: Secondary | ICD-10-CM | POA: Diagnosis not present

## 2023-03-26 DIAGNOSIS — F331 Major depressive disorder, recurrent, moderate: Secondary | ICD-10-CM | POA: Diagnosis not present

## 2023-03-26 DIAGNOSIS — F900 Attention-deficit hyperactivity disorder, predominantly inattentive type: Secondary | ICD-10-CM | POA: Diagnosis not present

## 2023-03-26 DIAGNOSIS — F411 Generalized anxiety disorder: Secondary | ICD-10-CM | POA: Diagnosis not present

## 2023-04-09 DIAGNOSIS — F331 Major depressive disorder, recurrent, moderate: Secondary | ICD-10-CM | POA: Diagnosis not present

## 2023-04-09 DIAGNOSIS — F900 Attention-deficit hyperactivity disorder, predominantly inattentive type: Secondary | ICD-10-CM | POA: Diagnosis not present

## 2023-04-09 DIAGNOSIS — F411 Generalized anxiety disorder: Secondary | ICD-10-CM | POA: Diagnosis not present

## 2023-04-22 DIAGNOSIS — F411 Generalized anxiety disorder: Secondary | ICD-10-CM | POA: Diagnosis not present

## 2023-04-22 DIAGNOSIS — F900 Attention-deficit hyperactivity disorder, predominantly inattentive type: Secondary | ICD-10-CM | POA: Diagnosis not present

## 2023-04-22 DIAGNOSIS — F331 Major depressive disorder, recurrent, moderate: Secondary | ICD-10-CM | POA: Diagnosis not present

## 2023-05-06 DIAGNOSIS — F411 Generalized anxiety disorder: Secondary | ICD-10-CM | POA: Diagnosis not present

## 2023-05-06 DIAGNOSIS — F331 Major depressive disorder, recurrent, moderate: Secondary | ICD-10-CM | POA: Diagnosis not present

## 2023-05-06 DIAGNOSIS — F900 Attention-deficit hyperactivity disorder, predominantly inattentive type: Secondary | ICD-10-CM | POA: Diagnosis not present

## 2023-05-13 DIAGNOSIS — F649 Gender identity disorder, unspecified: Secondary | ICD-10-CM | POA: Diagnosis not present

## 2023-05-13 DIAGNOSIS — Z79899 Other long term (current) drug therapy: Secondary | ICD-10-CM | POA: Diagnosis not present

## 2023-05-13 DIAGNOSIS — F902 Attention-deficit hyperactivity disorder, combined type: Secondary | ICD-10-CM | POA: Diagnosis not present

## 2023-05-13 DIAGNOSIS — F640 Transsexualism: Secondary | ICD-10-CM | POA: Diagnosis not present

## 2023-05-21 DIAGNOSIS — Z79899 Other long term (current) drug therapy: Secondary | ICD-10-CM | POA: Diagnosis not present

## 2023-05-21 DIAGNOSIS — F411 Generalized anxiety disorder: Secondary | ICD-10-CM | POA: Diagnosis not present

## 2023-05-21 DIAGNOSIS — F640 Transsexualism: Secondary | ICD-10-CM | POA: Diagnosis not present

## 2023-05-21 DIAGNOSIS — F331 Major depressive disorder, recurrent, moderate: Secondary | ICD-10-CM | POA: Diagnosis not present

## 2023-05-21 DIAGNOSIS — F900 Attention-deficit hyperactivity disorder, predominantly inattentive type: Secondary | ICD-10-CM | POA: Diagnosis not present

## 2023-06-18 DIAGNOSIS — F411 Generalized anxiety disorder: Secondary | ICD-10-CM | POA: Diagnosis not present

## 2023-06-18 DIAGNOSIS — F900 Attention-deficit hyperactivity disorder, predominantly inattentive type: Secondary | ICD-10-CM | POA: Diagnosis not present

## 2023-06-18 DIAGNOSIS — F331 Major depressive disorder, recurrent, moderate: Secondary | ICD-10-CM | POA: Diagnosis not present

## 2023-07-01 DIAGNOSIS — F900 Attention-deficit hyperactivity disorder, predominantly inattentive type: Secondary | ICD-10-CM | POA: Diagnosis not present

## 2023-07-01 DIAGNOSIS — F331 Major depressive disorder, recurrent, moderate: Secondary | ICD-10-CM | POA: Diagnosis not present

## 2023-07-01 DIAGNOSIS — F411 Generalized anxiety disorder: Secondary | ICD-10-CM | POA: Diagnosis not present

## 2023-07-15 DIAGNOSIS — F331 Major depressive disorder, recurrent, moderate: Secondary | ICD-10-CM | POA: Diagnosis not present

## 2023-07-15 DIAGNOSIS — F900 Attention-deficit hyperactivity disorder, predominantly inattentive type: Secondary | ICD-10-CM | POA: Diagnosis not present

## 2023-07-15 DIAGNOSIS — F411 Generalized anxiety disorder: Secondary | ICD-10-CM | POA: Diagnosis not present

## 2023-07-29 DIAGNOSIS — F331 Major depressive disorder, recurrent, moderate: Secondary | ICD-10-CM | POA: Diagnosis not present

## 2023-07-29 DIAGNOSIS — F900 Attention-deficit hyperactivity disorder, predominantly inattentive type: Secondary | ICD-10-CM | POA: Diagnosis not present

## 2023-07-29 DIAGNOSIS — F640 Transsexualism: Secondary | ICD-10-CM | POA: Diagnosis not present

## 2023-07-29 DIAGNOSIS — F411 Generalized anxiety disorder: Secondary | ICD-10-CM | POA: Diagnosis not present

## 2023-07-29 DIAGNOSIS — Z79899 Other long term (current) drug therapy: Secondary | ICD-10-CM | POA: Diagnosis not present

## 2023-08-12 DIAGNOSIS — F900 Attention-deficit hyperactivity disorder, predominantly inattentive type: Secondary | ICD-10-CM | POA: Diagnosis not present

## 2023-08-12 DIAGNOSIS — F331 Major depressive disorder, recurrent, moderate: Secondary | ICD-10-CM | POA: Diagnosis not present

## 2023-08-12 DIAGNOSIS — F411 Generalized anxiety disorder: Secondary | ICD-10-CM | POA: Diagnosis not present

## 2023-08-27 DIAGNOSIS — F331 Major depressive disorder, recurrent, moderate: Secondary | ICD-10-CM | POA: Diagnosis not present

## 2023-08-27 DIAGNOSIS — F411 Generalized anxiety disorder: Secondary | ICD-10-CM | POA: Diagnosis not present

## 2023-08-27 DIAGNOSIS — F900 Attention-deficit hyperactivity disorder, predominantly inattentive type: Secondary | ICD-10-CM | POA: Diagnosis not present

## 2023-09-09 DIAGNOSIS — F331 Major depressive disorder, recurrent, moderate: Secondary | ICD-10-CM | POA: Diagnosis not present

## 2023-09-09 DIAGNOSIS — F900 Attention-deficit hyperactivity disorder, predominantly inattentive type: Secondary | ICD-10-CM | POA: Diagnosis not present

## 2023-09-09 DIAGNOSIS — F411 Generalized anxiety disorder: Secondary | ICD-10-CM | POA: Diagnosis not present

## 2023-09-23 DIAGNOSIS — F411 Generalized anxiety disorder: Secondary | ICD-10-CM | POA: Diagnosis not present

## 2023-09-23 DIAGNOSIS — F331 Major depressive disorder, recurrent, moderate: Secondary | ICD-10-CM | POA: Diagnosis not present

## 2023-09-23 DIAGNOSIS — F900 Attention-deficit hyperactivity disorder, predominantly inattentive type: Secondary | ICD-10-CM | POA: Diagnosis not present

## 2023-10-08 DIAGNOSIS — F331 Major depressive disorder, recurrent, moderate: Secondary | ICD-10-CM | POA: Diagnosis not present

## 2023-10-08 DIAGNOSIS — F411 Generalized anxiety disorder: Secondary | ICD-10-CM | POA: Diagnosis not present

## 2023-10-08 DIAGNOSIS — F900 Attention-deficit hyperactivity disorder, predominantly inattentive type: Secondary | ICD-10-CM | POA: Diagnosis not present

## 2023-10-21 DIAGNOSIS — F331 Major depressive disorder, recurrent, moderate: Secondary | ICD-10-CM | POA: Diagnosis not present

## 2023-10-21 DIAGNOSIS — F411 Generalized anxiety disorder: Secondary | ICD-10-CM | POA: Diagnosis not present

## 2023-10-21 DIAGNOSIS — F900 Attention-deficit hyperactivity disorder, predominantly inattentive type: Secondary | ICD-10-CM | POA: Diagnosis not present

## 2023-11-10 DIAGNOSIS — F331 Major depressive disorder, recurrent, moderate: Secondary | ICD-10-CM | POA: Diagnosis not present

## 2023-11-10 DIAGNOSIS — F411 Generalized anxiety disorder: Secondary | ICD-10-CM | POA: Diagnosis not present

## 2023-11-10 DIAGNOSIS — F900 Attention-deficit hyperactivity disorder, predominantly inattentive type: Secondary | ICD-10-CM | POA: Diagnosis not present

## 2023-11-25 DIAGNOSIS — F411 Generalized anxiety disorder: Secondary | ICD-10-CM | POA: Diagnosis not present

## 2023-11-25 DIAGNOSIS — F331 Major depressive disorder, recurrent, moderate: Secondary | ICD-10-CM | POA: Diagnosis not present

## 2023-11-25 DIAGNOSIS — F900 Attention-deficit hyperactivity disorder, predominantly inattentive type: Secondary | ICD-10-CM | POA: Diagnosis not present
